# Patient Record
Sex: Female | Born: 1960 | Race: White | Hispanic: No | Smoking: Current every day smoker
Health system: Southern US, Community
[De-identification: ages and names within clinical notes are randomized; demographics above are authoritative.]

## PROBLEM LIST (undated history)

## (undated) DIAGNOSIS — N63 Unspecified lump in unspecified breast: Secondary | ICD-10-CM

## (undated) DIAGNOSIS — F329 Major depressive disorder, single episode, unspecified: Secondary | ICD-10-CM

## (undated) DIAGNOSIS — G629 Polyneuropathy, unspecified: Secondary | ICD-10-CM

## (undated) DIAGNOSIS — E119 Type 2 diabetes mellitus without complications: Secondary | ICD-10-CM

## (undated) DIAGNOSIS — F32A Depression, unspecified: Secondary | ICD-10-CM

## (undated) DIAGNOSIS — I10 Essential (primary) hypertension: Secondary | ICD-10-CM

## (undated) DIAGNOSIS — F331 Major depressive disorder, recurrent, moderate: Secondary | ICD-10-CM

## (undated) DIAGNOSIS — I999 Unspecified disorder of circulatory system: Secondary | ICD-10-CM

## (undated) HISTORY — DX: Major depressive disorder, recurrent, moderate: F33.1

## (undated) HISTORY — PX: TUBAL LIGATION: SHX77

---

## 2010-02-01 HISTORY — PX: OTHER SURGICAL HISTORY: SHX169

## 2012-07-26 ENCOUNTER — Ambulatory Visit: Payer: Self-pay | Admitting: Family Medicine

## 2014-03-27 ENCOUNTER — Ambulatory Visit: Payer: Self-pay | Admitting: Internal Medicine

## 2014-06-08 ENCOUNTER — Encounter: Payer: Self-pay | Admitting: *Deleted

## 2014-06-08 ENCOUNTER — Emergency Department
Admission: EM | Admit: 2014-06-08 | Discharge: 2014-06-08 | Disposition: A | Discharge status: Home / Self Care | Payer: Medicaid Other | Attending: Emergency Medicine | Admitting: Emergency Medicine

## 2014-06-08 DIAGNOSIS — Z880 Allergy status to penicillin: Secondary | ICD-10-CM | POA: Insufficient documentation

## 2014-06-08 DIAGNOSIS — I10 Essential (primary) hypertension: Secondary | ICD-10-CM | POA: Diagnosis not present

## 2014-06-08 DIAGNOSIS — E119 Type 2 diabetes mellitus without complications: Secondary | ICD-10-CM | POA: Insufficient documentation

## 2014-06-08 DIAGNOSIS — Z720 Tobacco use: Secondary | ICD-10-CM | POA: Insufficient documentation

## 2014-06-08 DIAGNOSIS — Z794 Long term (current) use of insulin: Secondary | ICD-10-CM | POA: Insufficient documentation

## 2014-06-08 DIAGNOSIS — R45851 Suicidal ideations: Secondary | ICD-10-CM | POA: Diagnosis present

## 2014-06-08 DIAGNOSIS — F331 Major depressive disorder, recurrent, moderate: Secondary | ICD-10-CM | POA: Diagnosis not present

## 2014-06-08 DIAGNOSIS — F411 Generalized anxiety disorder: Secondary | ICD-10-CM

## 2014-06-08 DIAGNOSIS — Z79899 Other long term (current) drug therapy: Secondary | ICD-10-CM | POA: Diagnosis not present

## 2014-06-08 HISTORY — DX: Essential (primary) hypertension: I10

## 2014-06-08 HISTORY — DX: Major depressive disorder, single episode, unspecified: F32.9

## 2014-06-08 HISTORY — DX: Type 2 diabetes mellitus without complications: E11.9

## 2014-06-08 HISTORY — DX: Depression, unspecified: F32.A

## 2014-06-08 LAB — URINE DRUG SCREEN, QUALITATIVE (ARMC ONLY)
AMPHETAMINES, UR SCREEN: NOT DETECTED
Barbiturates, Ur Screen: NOT DETECTED
Benzodiazepine, Ur Scrn: NOT DETECTED
COCAINE METABOLITE, UR ~~LOC~~: NOT DETECTED
Cannabinoid 50 Ng, Ur ~~LOC~~: POSITIVE — AB
MDMA (ECSTASY) UR SCREEN: NOT DETECTED
Methadone Scn, Ur: NOT DETECTED
Opiate, Ur Screen: NOT DETECTED
PHENCYCLIDINE (PCP) UR S: NOT DETECTED
Tricyclic, Ur Screen: NOT DETECTED

## 2014-06-08 LAB — COMPREHENSIVE METABOLIC PANEL
ALBUMIN: 4.3 g/dL (ref 3.5–5.0)
ALK PHOS: 95 U/L (ref 38–126)
ALT: 14 U/L (ref 14–54)
ANION GAP: 9 (ref 5–15)
AST: 21 U/L (ref 15–41)
BUN: 11 mg/dL (ref 6–20)
CHLORIDE: 103 mmol/L (ref 101–111)
CO2: 26 mmol/L (ref 22–32)
Calcium: 9 mg/dL (ref 8.9–10.3)
Creatinine, Ser: 0.69 mg/dL (ref 0.44–1.00)
GFR calc Af Amer: >60 mL/min (ref >60)
GFR calc non Af Amer: >60 mL/min (ref >60)
GLUCOSE: 83 mg/dL (ref 65–99)
POTASSIUM: 3.8 mmol/L (ref 3.5–5.1)
Sodium: 138 mmol/L (ref 135–145)
Total Bilirubin: 0.3 mg/dL (ref 0.3–1.2)
Total Protein: 7.5 g/dL (ref 6.5–8.1)

## 2014-06-08 LAB — GLUCOSE, CAPILLARY
Glucose-Capillary: 78 mg/dL (ref 70–99)
Glucose-Capillary: 442 mg/dL — ABNORMAL HIGH (ref 70–99)

## 2014-06-08 LAB — CBC
HEMATOCRIT: 44.7 % (ref 35.0–47.0)
Hemoglobin: 15.1 g/dL (ref 12.0–16.0)
MCH: 30.1 pg (ref 26.0–34.0)
MCHC: 33.8 g/dL (ref 32.0–36.0)
MCV: 89.1 fL (ref 80.0–100.0)
Platelets: 364 10*3/uL (ref 150–440)
RBC: 5.02 MIL/uL (ref 3.80–5.20)
RDW: 14.1 % (ref 11.5–14.5)
WBC: 10.9 10*3/uL (ref 3.6–11.0)

## 2014-06-08 LAB — SALICYLATE LEVEL: Salicylate Lvl: <4 mg/dL (ref 2.8–30.0)

## 2014-06-08 LAB — CBG MONITORING, ED: GLUCOSE, FINGERSTICK: 78

## 2014-06-08 LAB — ACETAMINOPHEN LEVEL: Acetaminophen (Tylenol), Serum: <10 ug/mL — ABNORMAL LOW (ref 10–30)

## 2014-06-08 LAB — ETHANOL

## 2014-06-08 MED ORDER — NICOTINE 10 MG IN INHA
1.0000 | RESPIRATORY_TRACT | Status: DC | PRN
  Administered 2014-06-08: 1 via RESPIRATORY_TRACT

## 2014-06-08 MED ORDER — INSULIN ASPART 100 UNIT/ML ~~LOC~~ SOLN: 0.0000 [IU] | Freq: Three times a day (TID) | SUBCUTANEOUS | Status: DC

## 2014-06-08 MED ORDER — SERTRALINE HCL 50 MG PO TABS
ORAL_TABLET | ORAL | Status: AC
  Administered 2014-06-08: 50 mg via ORAL
  Filled 2014-06-08: qty 1

## 2014-06-08 MED ORDER — INSULIN ASPART 100 UNIT/ML ~~LOC~~ SOLN: 0.0000 [IU] | Freq: Once | SUBCUTANEOUS | Status: DC

## 2014-06-08 MED ORDER — INSULIN ASPART 100 UNIT/ML ~~LOC~~ SOLN
SUBCUTANEOUS | Status: AC
  Filled 2014-06-08: qty 10

## 2014-06-08 MED ORDER — GABAPENTIN 300 MG PO CAPS: 300.0000 mg | ORAL_CAPSULE | Freq: Three times a day (TID) | ORAL | Status: DC | PRN

## 2014-06-08 MED ORDER — HYDROXYZINE HCL 25 MG PO TABS: 50.0000 mg | ORAL_TABLET | Freq: Once | ORAL | Status: DC

## 2014-06-08 MED ORDER — INSULIN DETEMIR 100 UNIT/ML ~~LOC~~ SOLN
8.0000 [IU] | Freq: Every day | SUBCUTANEOUS | Status: DC
  Filled 2014-06-08: qty 0.08

## 2014-06-08 MED ORDER — HYDROXYZINE HCL 10 MG PO TABS: 10.0000 mg | ORAL_TABLET | Freq: Three times a day (TID) | ORAL | Status: DC | PRN

## 2014-06-08 MED ORDER — INSULIN ASPART 100 UNIT/ML ~~LOC~~ SOLN
0.0000 [IU] | Freq: Three times a day (TID) | SUBCUTANEOUS | Status: DC
  Administered 2014-06-08: 4 [IU] via SUBCUTANEOUS

## 2014-06-08 MED ORDER — SERTRALINE HCL 50 MG PO TABS
50.0000 mg | ORAL_TABLET | Freq: Every day | ORAL | Status: DC
  Administered 2014-06-08: 50 mg via ORAL

## 2014-06-08 MED ORDER — NICOTINE 10 MG IN INHA
RESPIRATORY_TRACT | Status: AC
  Administered 2014-06-08: 1 via RESPIRATORY_TRACT
  Filled 2014-06-08: qty 36

## 2014-06-08 MED ORDER — LISINOPRIL 20 MG PO TABS
10.0000 mg | ORAL_TABLET | Freq: Every day | ORAL | Status: DC
  Administered 2014-06-08: 10 mg via ORAL

## 2014-06-08 MED ORDER — PANTOPRAZOLE SODIUM 40 MG PO TBEC: 40.0000 mg | DELAYED_RELEASE_TABLET | Freq: Every day | ORAL | Status: DC | PRN

## 2014-06-08 MED ORDER — INSULIN DETEMIR 100 UNIT/ML ~~LOC~~ SOLN
12.0000 [IU] | Freq: Every day | SUBCUTANEOUS | Status: DC
  Administered 2014-06-08: 12 [IU] via SUBCUTANEOUS
  Filled 2014-06-08 (×2): qty 0.12

## 2014-06-08 MED ORDER — LISINOPRIL 20 MG PO TABS
ORAL_TABLET | ORAL | Status: AC
  Administered 2014-06-08: 10 mg via ORAL
  Filled 2014-06-08: qty 1

## 2014-06-08 NOTE — ED Notes (Signed)
Blood glucose 442. SSI ordered being changed per request of Malinda, MD to patients usual dosage of SSI.

## 2014-06-08 NOTE — ED Notes (Signed)
Psych, MD Mcqueen at bedside at this time.

## 2014-06-08 NOTE — BH Assessment (Signed)
Assessment Note  Sabrina Wells is an 54 y.o. female Who presents to ER via law enforcement due to voicing SI to her boyfriend. Pt. states she told him that because he had her car for three days and she was unable to get intouch with him.  She believes he was in [place] for work. However, she wasn't for sure. After the pt. told the boyfriend she was going to "take an extra dose of insulin and end it all," Law Enforcement came to the house, to bring her to the ER. Pt. states, she still hasn't seen the boyfriend and doesn't know where he is at.  She further explains, she has no history of SI/HI and AV/H. She also denies any involvement with the legal system.  She initially lived in [place] and moved to [place] with her husband. After staying in [place], the husband left her. She then moved in with her boyfriend, at that time they were friends. She states, it isn't uncommon for the boyfriend to go away for several days with her car.  Writer called number provided for boyfriend Legrand Como [place]-[phone]). The individual who answered the phone stated he wasn't home and was unable to give a time they would be expected him.   Axis I: Anxiety Disorder NOS Axis III:  Past Medical History  Diagnosis Date  . Diabetes mellitus without complication   . Hypertension   . Depressed    Axis IV: economic problems, housing problems, other psychosocial or environmental problems, problems related to social environment, problems with access to health care services and problems with primary support group  Past Medical History:  Past Medical History  Diagnosis Date  . Diabetes mellitus without complication   . Hypertension   . Depressed     No past surgical history on file.  Family History: No family history on file.  Social History:  reports that she has been smoking Cigarettes.  She does not have any smokeless tobacco history on file. She reports that she uses illicit drugs (Marijuana). She reports that she  does not drink alcohol.  Additional Social History:  Alcohol / Drug Use Pain Medications: None reported Prescriptions: None reported Over the Counter: None reported History of alcohol / drug use?: No history of alcohol / drug abuse Longest period of sobriety (when/how long):  (None reported)  CIWA: CIWA-Ar BP: 140/65 mmHg Pulse Rate: 70 COWS:    Allergies:  Allergies  Allergen Reactions  . Penicillins Hives    Home Medications:  (Not in a hospital admission)  OB/GYN Status:  No LMP recorded. Patient is not currently having periods (Reason: Perimenopausal).  General Assessment Data Location of Assessment: [facility] ED TTS Assessment: In system Is this a Tele or Face-to-Face Assessment?: Face-to-Face Is this an Initial Assessment or a Re-assessment for this encounter?: Initial Assessment Marital status: Single Maiden name: None Is patient pregnant?: No Pregnancy Status: No Living Arrangements: Spouse/significant other Can pt return to current living arrangement?: Yes Admission Status: Involuntary Is patient capable of signing voluntary admission?: Yes Referral Source: Other Risk manager) Insurance type: Medicaid  Medical Screening Exam ([place]) Medical Exam completed: Yes  Crisis Care Plan Living Arrangements: Spouse/significant other  Education Status Is patient currently in school?: No Current Grade: None Highest grade of school patient has completed: 12  Risk to self with the past 6 months Suicidal Ideation: No Suicidal Intent: No Has patient had any suicidal intent within the past 6 months prior to admission? : No Is patient at risk  for suicide?: No Suicidal Plan?: No Has patient had any suicidal plan within the past 6 months prior to admission? : No Access to Means: No What has been your use of drugs/alcohol within the last 12 months?: None reported Previous Attempts/Gestures: No How many times?: 0 Other Self Harm Risks: None  reported Triggers for Past Attempts: Other (Comment) (None reported) Intentional Self Injurious Behavior: None Family Suicide History: No Recent stressful life event(s): Other (Comment) (None reported) Persecutory voices/beliefs?: No Depression: No Depression Symptoms: Feeling angry/irritable Substance abuse history and/or treatment for substance abuse?: No Suicide prevention information given to non-admitted patients: Not applicable  Risk to Others within the past 6 months Homicidal Ideation: No Does patient have any lifetime risk of violence toward others beyond the six months prior to admission? : No Thoughts of Harm to Others: No Current Homicidal Intent: No Current Homicidal Plan: No Access to Homicidal Means: No Identified Victim: None reported History of harm to others?: No Assessment of Violence: None Noted Violent Behavior Description: None reported Does patient have access to weapons?: No Criminal Charges Pending?: No Does patient have a court date: No Is patient on probation?: No  Psychosis Hallucinations: None noted Delusions: None noted  Mental Status Report Appearance/Hygiene: Unremarkable, In hospital gown Eye Contact: Fair Motor Activity: Unremarkable Speech: Logical/coherent Level of Consciousness: Alert Mood: Irritable Affect: Appropriate to circumstance Anxiety Level: Minimal Thought Processes: Coherent, Relevant Judgement: Unimpaired Orientation: Person, Place, Time, Situation, Appropriate for developmental age Obsessive Compulsive Thoughts/Behaviors: None  Cognitive Functioning Concentration: Normal Memory: Recent Intact, Remote Intact IQ: Average Insight: Fair Impulse Control: Fair Appetite: Good Weight Loss: 0 Weight Gain: 0 Sleep: No Change Total Hours of Sleep: 8 Vegetative Symptoms: None  ADLScreening [facility] Assessment Services) Patient's cognitive ability adequate to safely complete daily activities?: Yes Patient able to express  need for assistance with ADLs?: Yes Independently performs ADLs?: Yes (appropriate for developmental age)  Prior Inpatient Therapy Prior Inpatient Therapy: No  Prior Outpatient Therapy Prior Outpatient Therapy: No Does patient have an ACCT team?: No Does patient have Intensive In-House Services?  : No Does patient have [facility] services? : No Does patient have P4CC services?: Unknown  ADL Screening (condition at time of admission) Patient's cognitive ability adequate to safely complete daily activities?: Yes Patient able to express need for assistance with ADLs?: Yes Independently performs ADLs?: Yes (appropriate for developmental age)       Abuse/Neglect Assessment (Assessment to be complete while patient is alone) Physical Abuse: Denies Verbal Abuse: Denies Sexual Abuse: Denies Exploitation of patient/patient's resources: Denies Self-Neglect: Denies Possible abuse reported to::  (None reported) Values / Beliefs Cultural Requests During Hospitalization: None Spiritual Requests During Hospitalization: None Consults Spiritual Care Consult Needed: No Social Work Consult Needed: No Regulatory affairs officer (For Healthcare) Does patient have an advance directive?: No    Additional Information 1:1 In Past 12 Months?: No CIRT Risk: No Elopement Risk: No Does patient have medical clearance?: Yes  Child/Adolescent Assessment Running Away Risk: Denies (Pt. is an adult)  Disposition:  Disposition Initial Assessment Completed for this Encounter: Yes Disposition of Patient: Other dispositions (Psych MD see) Other disposition(s): Other (Comment) (Psych MD to see)  On Site Evaluation by:   Reviewed with Physician:    Gunnar Fusi, MS, LCAS, [place], [place], CSSI 06/08/2014 2:47 PM

## 2014-06-08 NOTE — ED Notes (Signed)
Pt refuses 10 units of regular SSI. States that she can not take that much or it will drop her blood glucose to low. Cinda Quest, MD informed

## 2014-06-08 NOTE — ED Provider Notes (Signed)
-----------------------------------------   5:15 PM on 06/08/2014 -----------------------------------------  Psychiatry recommendations reviewed. Patient is medically and psychiatrically stable at this time with moderate depression. Will follow up with establish care at [facility].  Carrie Mew, MD 06/08/14 [id number]

## 2014-06-08 NOTE — ED Notes (Signed)
cbg 147. Breakfast tray ordered from [facility]

## 2014-06-08 NOTE — ED Notes (Signed)
Blood glucose 303 at this time.

## 2014-06-08 NOTE — ED Provider Notes (Signed)
[facility] Emergency Department Provider Note    ____________________________________________  Time seen: 4:45 AM  I have reviewed the triage vital signs and the nursing notes.   HISTORY  Chief Complaint Suicidal       HPI Sabrina Wells is a 54 y.o. female with a history of insulin-dependent diabetes who presents to the after a argument with her boyfriend. She stated that she wanted to kill herself by overdosing on insulin. Patient denies taking any action and states that she just said this to gain attention, however she did report some passive suicidal thoughts as well to triage nurse which prompted an IVC to be placed.  Patient states she is in her normal state of health, has no acute concerns no hallucinations, she denies being suicidal at this time, denies previous history of suicidality, she does report that she ran out of her antidepressant about one week ago due to financial cost and has a long history of depression.  She does smoke, she denies alcohol abuse, she denies illegal drug abuse save an occasional joint.   Past Medical History  Diagnosis Date  . Diabetes mellitus without complication   . Hypertension   . Depressed     There are no active problems to display for this patient.   No past surgical history on file.  Current Outpatient Rx  Name  Route  Sig  Dispense  Refill  . insulin aspart (NOVOLOG) 100 UNIT/ML injection   Subcutaneous   Inject into the skin 3 (three) times daily with meals. SSI         . insulin detemir (LEVEMIR) 100 UNIT/ML injection   Subcutaneous   Inject 12 Units into the skin 2 (two) times daily. 12 units in morning 8 units at night         . lisinopril (PRINIVIL,ZESTRIL) 10 MG tablet   Oral   Take 10 mg by mouth daily.         Marland Kitchen FLUoxetine (PROZAC) 10 MG capsule   Oral   Take 10 mg by mouth daily. Pt is unsure of dosage.           Allergies Penicillins  No family history on  file.  Social History History  Substance Use Topics  . Smoking status: Current Every Day Smoker    Types: Cigarettes  . Smokeless tobacco: Not on file  . Alcohol Use: No    Review of Systems  Constitutional: Negative for fever. Eyes: Negative for visual changes. ENT: Negative for sore throat. Cardiovascular: Negative for chest pain. Respiratory: Negative for shortness of breath. Gastrointestinal: Negative for abdominal pain, vomiting and diarrhea. Genitourinary: Negative for dysuria. Musculoskeletal: Negative for back pain. Skin: Negative for rash. Neurological: Negative for headaches, focal weakness or numbness. Denies active suicidal ideation, states she did have passive thoughts. Denies any overdose or attempted her life. She specifically denies having injected insulin and his suicide attempt. She denies any homicidal thoughts. She occasionally has disputes with her boyfriend who she states is presently running about in [place].  10-point ROS otherwise negative.  ____________________________________________   PHYSICAL EXAM:  VITAL SIGNS: ED Triage Vitals  Enc Vitals Group     BP 06/08/14 0400 156/98 mmHg     Pulse Rate 06/08/14 0400 90     Resp 06/08/14 0400 20     Temp 06/08/14 0400 97.9 F (36.6 C)     Temp Source 06/08/14 0400 Oral     SpO2 06/08/14 0400 98 %  Weight 06/08/14 0400 110 lb (49.896 kg)     Height 06/08/14 0400 '5\' 3"'$  (1.6 m)     Head Cir --      Peak Flow --      Pain Score 06/08/14 0402 0     Pain Loc --      Pain Edu? --      Excl. in [place]? --      Constitutional: Alert and oriented. Well appearing and in no distress. Eyes: Conjunctivae are normal. PERRL. Normal extraocular movements. ENT   Head: Normocephalic and atraumatic.   Nose: No congestion/rhinnorhea.   Mouth/Throat: Mucous membranes are moist.   Neck: No stridor. Hematological/Lymphatic/Immunilogical: No cervical lymphadenopathy. Cardiovascular: Normal rate,  regular rhythm. Normal and symmetric distal pulses are present in all extremities. No murmurs, rubs, or gallops. Respiratory: Normal respiratory effort without tachypnea nor retractions. Breath sounds are clear and equal bilaterally. No wheezes/rales/rhonchi. Gastrointestinal: Soft and nontender. No distention. No abdominal bruits. There is no CVA tenderness. Genitourinary:  Musculoskeletal: Nontender with normal range of motion in all extremities. No joint effusions.  No lower extremity tenderness nor edema. Neurologic:  Normal speech and language. No gross focal neurologic deficits are appreciated. Speech is normal. No gait instability. Skin:  Skin is warm, dry and intact. No rash noted. Psychiatric: Mood and affect are normal. Speech and behavior are normal. Patient exhibits appropriate insight and judgment.  Overall patient has a very normal exam at this time and seems psychiatrically stable, but we will refer her for psychiatric evaluation given her commitment status and previous statements of suicidality.  ____________________________________________    LABS (pertinent positives/negatives)  Labs Reviewed  ACETAMINOPHEN LEVEL - Abnormal; Notable for the following:    Acetaminophen (Tylenol), Serum <10 (*)    All other components within normal limits  URINE DRUG SCREEN, QUALITATIVE ([facility]) - Abnormal; Notable for the following:    Cannabinoid 50 Ng, Ur [place] POSITIVE (*)    All other components within normal limits  CBC  COMPREHENSIVE METABOLIC PANEL  ETHANOL  SALICYLATE LEVEL  CBG MONITORING, ED  CBG MONITORING, ED  CBG MONITORING, ED  CBG MONITORING, ED     ____________________________________________  ____________________________________________   PROCEDURES  Procedure(s) performed: None  Critical Care performed: No  ____________________________________________   INITIAL IMPRESSION / ASSESSMENT AND PLAN / ED COURSE  Pertinent labs & imaging results that were  available during my care of the patient were reviewed by me and considered in my medical decision making (see chart for details).  Passive suicidal ideation. Major depression.  No acute medical complaints.  Consult psychiatry. Plan signed out to Dr. Rip Harbour.  ____________________________________________   FINAL CLINICAL IMPRESSION(S) / ED DIAGNOSES  Final diagnoses:  None   major depression initial acute   Delman Kitten, MD 06/08/14 0800

## 2014-06-08 NOTE — ED Notes (Signed)
BEHAVIORAL HEALTH ROUNDING Patient sleeping: Yes.   Patient alert and oriented: yes Behavior appropriate: Yes.  ; If no, describe:  Nutrition and fluids offered: Yes  Toileting and hygiene offered: Yes  Sitter present: yes Law enforcement present: Yes  

## 2014-06-08 NOTE — ED Notes (Signed)
Pt to be discharged. Pt given to call a safe ride.

## 2014-06-08 NOTE — Discharge Instructions (Signed)
Major Depressive Disorder Major depressive disorder is a mental illness. It also may be called clinical depression or unipolar depression. Major depressive disorder usually causes feelings of sadness, hopelessness, or helplessness. Some people with this disorder do not feel particularly sad but lose interest in doing things they used to enjoy (anhedonia). Major depressive disorder also can cause physical symptoms. It can interfere with work, school, relationships, and other normal everyday activities. The disorder varies in severity but is longer lasting and more serious than the sadness we all feel from time to time in our lives. Major depressive disorder often is triggered by stressful life events or major life changes. Examples of these triggers include divorce, loss of your job or home, a move, and the death of a family member or close friend. Sometimes this disorder occurs for no obvious reason at all. People who have family members with major depressive disorder or bipolar disorder are at higher risk for developing this disorder, with or without life stressors. Major depressive disorder can occur at any age. It may occur just once in your life (single episode major depressive disorder). It may occur multiple times (recurrent major depressive disorder). SYMPTOMS People with major depressive disorder have either anhedonia or depressed mood on nearly a daily basis for at least 2 weeks or longer. Symptoms of depressed mood include:  Feelings of sadness (blue or down in the dumps) or emptiness.  Feelings of hopelessness or helplessness.  Tearfulness or episodes of crying (may be observed by others).  Irritability (children and adolescents). In addition to depressed mood or anhedonia or both, people with this disorder have at least four of the following symptoms:  Difficulty sleeping or sleeping too much.   Significant change (increase or decrease) in appetite or weight.   Lack of energy or  motivation.  Feelings of guilt and worthlessness.   Difficulty concentrating, remembering, or making decisions.  Unusually slow movement (psychomotor retardation) or restlessness (as observed by others).   Recurrent wishes for death, recurrent thoughts of self-harm (suicide), or a suicide attempt. People with major depressive disorder commonly have persistent negative thoughts about themselves, other people, and the world. People with severe major depressive disorder may experiencedistorted beliefs or perceptions about the world (psychotic delusions). They also may see or hear things that are not real (psychotic hallucinations). DIAGNOSIS Major depressive disorder is diagnosed through an assessment by your health care provider. Your health care provider will ask aboutaspects of your daily life, such as mood,sleep, and appetite, to see if you have the diagnostic symptoms of major depressive disorder. Your health care provider may ask about your medical history and use of alcohol or drugs, including prescription medicines. Your health care provider also may do a physical exam and blood work. This is because certain medical conditions and the use of certain substances can cause major depressive disorder-like symptoms (secondary depression). Your health care provider also may refer you to a mental health specialist for further evaluation and treatment. TREATMENT It is important to recognize the symptoms of major depressive disorder and seek treatment. The following treatments can be prescribed for this disorder:   Medicine. Antidepressant medicines usually are prescribed. Antidepressant medicines are thought to correct chemical imbalances in the brain that are commonly associated with major depressive disorder. Other types of medicine may be added if the symptoms do not respond to antidepressant medicines alone or if psychotic delusions or hallucinations occur.  Talk therapy. Talk therapy can be  helpful in treating major depressive disorder by providing  support, education, and guidance. Certain types of talk therapy also can help with negative thinking (cognitive behavioral therapy) and with relationship issues that trigger this disorder (interpersonal therapy). °A mental health specialist can help determine which treatment is best for you. Most people with major depressive disorder do well with a combination of medicine and talk therapy. Treatments involving electrical stimulation of the brain can be used in situations with extremely severe symptoms or when medicine and talk therapy do not work over time. These treatments include electroconvulsive therapy, transcranial magnetic stimulation, and vagal nerve stimulation. °Document Released: 05/15/2012 Document Revised: 06/04/2013 Document Reviewed: 05/15/2012 °ExitCare® Patient Information ©2015 [employer]. This information is not intended to replace advice given to you by your health care provider. Make sure you discuss any questions you have with your health care provider. ° °

## 2014-06-08 NOTE — ED Notes (Signed)
ED BHU PLACEMENT JUSTIFICATION Is the patient under IVC or is there intent for IVC: No. Is the patient medically cleared: No. Is there vacancy in the ED BHU: No. Is the population mix appropriate for patient: Yes.   Is the patient awaiting placement in inpatient or outpatient setting: No. Has the patient had a psychiatric consult: No. Survey of unit performed for contraband, proper placement and condition of furniture, tampering with fixtures in bathroom, shower, and each patient room: Yes.  ; Findings:  APPEARANCE/BEHAVIOR calm and cooperative NEURO ASSESSMENT Orientation: time, place and person Hallucinations: No.None noted (Hallucinations) Speech: Normal Gait: normal RESPIRATORY ASSESSMENT Normal expansion.  Clear to auscultation.  No rales, rhonchi, or wheezing. CARDIOVASCULAR ASSESSMENT regular rate and rhythm, S1, S2 normal, no murmur, click, rub or gallop GASTROINTESTINAL ASSESSMENT soft, nontender, BS WNL, no r/g EXTREMITIES normal strength, tone, and muscle mass, no deformities PLAN OF CARE Provide calm/safe environment. Vital signs assessed twice daily. ED BHU Assessment once each 12-hour shift. Collaborate with intake RN daily or as condition indicates. Assure the ED provider has rounded once each shift. Provide and encourage hygiene. Provide redirection as needed. Assess for escalating behavior; address immediately and inform ED provider.  Assess family dynamic and appropriateness for visitation as needed: Yes.  ; If necessary, describe findings:  Educate the patient/family about BHU procedures/visitation: Yes.  ; If necessary, describe findings:

## 2014-06-08 NOTE — ED Notes (Signed)
CBG- 266 

## 2014-06-08 NOTE — ED Notes (Signed)

## 2014-06-08 NOTE — ED Notes (Signed)
Nurse called pharmacy pt waiting for Levimir to be sent from pharmacy

## 2014-06-08 NOTE — Consult Note (Addendum)
[facility] Consult   Reason for Consult:  Depression Referring Physician:  [facility]  Patient Identification: Sabrina Wells MRN:  [id number]  Principal Diagnosis: MDD, recurrent, moderate  Diagnosis:   Patient Active Problem List   Diagnosis Date Noted  . GAD (generalized anxiety disorder) [F41.1] 06/08/2014  . MDD (major depressive disorder), recurrent episode, moderate [F33.1]     Total Time spent with patient: 30 minutes  Subjective: "I'm feeling lonely."  HPI:   Sabrina Wells is a 54 y.o. female patient admitted with MDD, recurrent moderate in the context of a poor relationship with her fianc.  She shared she gave her fiance her car to drive to his job and did not hear from him yesterday. He was to return from [place], [place] after his job last night and she shared she was not able to get in contact with him until around 2:30am when she stated "I'm just done with life." She shared she continues to feel lonely in her current situation as her family does not like her partner and will not visit her regularly.  Sabrina Wells noted she has been with in her current relationship for about 27 months.  She shared that after she told her fiance that she was done with him, he called 911.  She denies stating she was suicidal.   Triggers to her current depression include her sister dying of cancer on 02-28-2014. She also shared her mother died 32 years ago of cancer.  She discussed her marriage which was not a good relationship, being increasingly tearful and decreased contact with her family (daughter and grandchildren).  Other triggers include financial stressors. Due to this, she is frustrated and irritable frequently. Her current protective factors include her family & religion. Pt stated she does not want to want to kill herself and reiterated the aforementioned protective factors. She also shared she made these comments to get her fianc to return her car home. She identified  several supportive friends who live down the street and visited the pt while hospitalized. She also shared she has not attempted suicide in the past.  She denies she is a cutter and there are no weapons in the home.   She was on Zoloft in the past which was helpful but she has not taken it in 2 weeks. She states she has not been able to afford her medications. Pt also endoreses restlessness, fatigue, muscle tension, variable sleep. She denies having panic attacks.   Per pt's fianc, the pt has been lonely and "misses her life." The pt stated that she wanted to end her life last night and wanted to overdose on insulin. She did not answer the phone thereafter. The [employer] brought the pt into the ED. The pt has been experiencing more depression and loneliness recently. She has experienced financial difficulties and does not talk to her family as often. Her fianc's family is nearby and willing to help the pt. He stated the pt did what she did in an attempt to get attention from him because he had her car last evening.     HPI Elements:   Location:  see HPI. Quality:  worsening. Severity:  moderate to severe. Timing:  worsening for 2 weeks. Duration:  several months. Context:  see HPI.  Past Medical History:  Past Medical History  Diagnosis Date  . Diabetes mellitus without complication   . Hypertension   . Depressed    No past surgical history on file. Family History: No  family history on file. Social History:  History  Alcohol Use No     History  Drug Use  . Yes  . Special: Marijuana    History   Social History  . Marital Status: Legally Separated    Spouse Name: N/A  . Number of Children: N/A  . Years of Education: N/A   Social History Main Topics  . Smoking status: Current Every Day Smoker    Types: Cigarettes  . Smokeless tobacco: Not on file  . Alcohol Use: No  . Drug Use: Yes    Special: Marijuana  . Sexual Activity: No   Other Topics Concern  . Not on  file   Social History Narrative  . No narrative on file   Additional Social History:    Allergies:   Allergies  Allergen Reactions  . Penicillins Hives    Labs:  Results for orders placed or performed during the hospital encounter of 06/08/14 (from the past 48 hour(s))  CBG monitoring, ED     Status: None   Collection Time: 06/08/14  4:26 AM  Result Value Ref Range   Glucose, fingerstick 78   CBC     Status: None   Collection Time: 06/08/14  4:30 AM  Result Value Ref Range   WBC 10.9 3.6 - 11.0 K/uL   RBC 5.02 3.80 - 5.20 MIL/uL   Hemoglobin 15.1 12.0 - 16.0 g/dL   HCT 44.7 35.0 - 47.0 %   MCV 89.1 80.0 - 100.0 fL   MCH 30.1 26.0 - 34.0 pg   MCHC 33.8 32.0 - 36.0 g/dL   RDW 14.1 11.5 - 14.5 %   Platelets 364 150 - 440 K/uL  Comprehensive metabolic panel     Status: None   Collection Time: 06/08/14  4:30 AM  Result Value Ref Range   Sodium 138 135 - 145 mmol/L   Potassium 3.8 3.5 - 5.1 mmol/L   Chloride 103 101 - 111 mmol/L   CO2 26 22 - 32 mmol/L   Glucose, Bld 83 65 - 99 mg/dL   BUN 11 6 - 20 mg/dL   Creatinine, Ser 0.69 0.44 - 1.00 mg/dL   Calcium 9.0 8.9 - 10.3 mg/dL   Total Protein 7.5 6.5 - 8.1 g/dL   Albumin 4.3 3.5 - 5.0 g/dL   AST 21 15 - 41 U/L   ALT 14 14 - 54 U/L   Alkaline Phosphatase 95 38 - 126 U/L   Total Bilirubin 0.3 0.3 - 1.2 mg/dL   GFR calc non Af Amer >60 >60 mL/min   GFR calc Af Amer >60 >60 mL/min    Comment: (NOTE) The eGFR has been calculated using the CKD EPI equation. This calculation has not been validated in all clinical situations. eGFR's persistently <60 mL/min signify possible Chronic Kidney Disease.    Anion gap 9 5 - 15  Ethanol (ETOH)     Status: None   Collection Time: 06/08/14  4:30 AM  Result Value Ref Range   Alcohol, Ethyl (B) <5 <5 mg/dL    Comment:        LOWEST DETECTABLE LIMIT FOR SERUM ALCOHOL IS 11 mg/dL FOR MEDICAL PURPOSES ONLY   Acetaminophen level     Status: Abnormal   Collection Time: 06/08/14   4:30 AM  Result Value Ref Range   Acetaminophen (Tylenol), Serum <10 (L) 10 - 30 ug/mL    Comment:        THERAPEUTIC CONCENTRATIONS VARY SIGNIFICANTLY. A RANGE OF 10-30  ug/mL MAY BE AN EFFECTIVE CONCENTRATION FOR MANY PATIENTS. HOWEVER, SOME ARE BEST TREATED AT CONCENTRATIONS OUTSIDE THIS RANGE. ACETAMINOPHEN CONCENTRATIONS >150 ug/mL AT 4 HOURS AFTER INGESTION AND >50 ug/mL AT 12 HOURS AFTER INGESTION ARE OFTEN ASSOCIATED WITH TOXIC REACTIONS.   Salicylate level     Status: None   Collection Time: 06/08/14  4:30 AM  Result Value Ref Range   Salicylate Lvl <5.8 2.8 - 30.0 mg/dL  Urine Drug Screen, Qualitative [facility])     Status: Abnormal   Collection Time: 06/08/14  5:17 AM  Result Value Ref Range   Tricyclic, Ur Screen NONE DETECTED NONE DETECTED   Amphetamines, Ur Screen NONE DETECTED NONE DETECTED   MDMA (Ecstasy)Ur Screen NONE DETECTED NONE DETECTED   Cocaine Metabolite,Ur [place] NONE DETECTED NONE DETECTED   Opiate, Ur Screen NONE DETECTED NONE DETECTED   Phencyclidine (PCP) Ur S NONE DETECTED NONE DETECTED   Cannabinoid 50 Ng, Ur [place] POSITIVE (A) NONE DETECTED   Barbiturates, Ur Screen NONE DETECTED NONE DETECTED   Benzodiazepine, Ur Scrn NONE DETECTED NONE DETECTED   Methadone Scn, Ur NONE DETECTED NONE DETECTED    Comment: (NOTE) 527  Tricyclics, urine               Cutoff 1000 ng/mL 200  Amphetamines, urine             Cutoff 1000 ng/mL 300  MDMA (Ecstasy), urine           Cutoff 500 ng/mL 400  Cocaine Metabolite, urine       Cutoff 300 ng/mL 500  Opiate, urine                   Cutoff 300 ng/mL 600  Phencyclidine (PCP), urine      Cutoff 25 ng/mL 700  Cannabinoid, urine              Cutoff 50 ng/mL 800  Barbiturates, urine             Cutoff 200 ng/mL 900  Benzodiazepine, urine           Cutoff 200 ng/mL 1000 Methadone, urine                Cutoff 300 ng/mL 1100 1200 The urine drug screen provides only a preliminary, unconfirmed 1300 analytical test result and  should not be used for non-medical 1400 purposes. Clinical consideration and professional judgment should 1500 be applied to any positive drug screen result due to possible 1600 interfering substances. A more specific alternate chemical method 1700 must be used in order to obtain a confirmed analytical result.  1800 Gas chromato graphy / mass spectrometry (GC/MS) is the preferred 1900 confirmatory method.     Vitals: Blood pressure 140/65, pulse 70, temperature 97.8 F (36.6 C), temperature source Oral, resp. rate 18, height $RemoveBe'5\' 3"'eRhvWOakz$  (1.6 m), weight 49.896 kg (110 lb), SpO2 95 %.  Risk to Self: Is patient at risk for suicide?: Yes Risk to Others:   Prior Inpatient Therapy:   Prior Outpatient Therapy:    Current Facility-Administered Medications  Medication Dose Route Frequency Provider Last Rate Last Dose  . gabapentin (NEURONTIN) capsule 300 mg  300 mg Oral TID PRN Nena Polio, MD      . insulin aspart (novoLOG) injection 0-5 Units  0-5 Units Subcutaneous TID WC & HS Nena Polio, MD   4 Units at 06/08/14 1404  . insulin detemir (LEVEMIR) injection 12 Units  12 Units Subcutaneous Daily Regan Rakers  Cinda Quest, MD   12 Units at 06/08/14 1326  . insulin detemir (LEVEMIR) injection 8 Units  8 Units Subcutaneous QHS Nena Polio, MD      . lisinopril (PRINIVIL,ZESTRIL) tablet 10 mg  10 mg Oral Daily Nena Polio, MD   10 mg at 06/08/14 1325  . nicotine (NICOTROL) 10 MG inhaler 1 continuous puffing  1 continuous puffing Inhalation PRN Delman Kitten, MD   1 continuous puffing at 06/08/14 0640  . pantoprazole (PROTONIX) EC tablet 40 mg  40 mg Oral Daily PRN Nena Polio, MD      . sertraline (ZOLOFT) tablet 50 mg  50 mg Oral Daily Nena Polio, MD   50 mg at 06/08/14 1327   Current Outpatient Prescriptions  Medication Sig Dispense Refill  . gabapentin (NEURONTIN) 300 MG capsule Take 300 mg by mouth 3 (three) times daily as needed (for leg pain.).    Marland Kitchen insulin aspart (NOVOLOG) 100 UNIT/ML  injection Inject into the skin 3 (three) times daily with meals. SSI    . insulin detemir (LEVEMIR) 100 UNIT/ML injection Inject 12 Units into the skin 2 (two) times daily. 12 units in morning 8 units at night    . lisinopril (PRINIVIL,ZESTRIL) 10 MG tablet Take 10 mg by mouth daily.    Marland Kitchen omeprazole (PRILOSEC) 20 MG capsule Take 20 mg by mouth daily as needed (for heartburn or indigestion).    . sertraline (ZOLOFT) 50 MG tablet Take 50 mg by mouth daily.      Musculoskeletal: Strength & Muscle Tone: within normal limits Gait & Station: normal Patient leans: N/A  Psychiatric Specialty Exam: Physical Exam  Review of Systems  Constitutional: Positive for malaise/fatigue.  HENT: Negative.   Eyes: Negative.   Respiratory: Negative.   Cardiovascular: Negative.   Gastrointestinal: Negative.   Genitourinary: Negative.   Musculoskeletal: Negative.   Skin: Negative.   Neurological: Negative.   Endo/Heme/Allergies: Negative.   Psychiatric/Behavioral: Positive for depression. Negative for suicidal ideas, hallucinations, memory loss and substance abuse. The patient is nervous/anxious and has insomnia.     Blood pressure 140/65, pulse 70, temperature 97.8 F (36.6 C), temperature source Oral, resp. rate 18, height $RemoveBe'5\' 3"'EfokhhGxf$  (1.6 m), weight 49.896 kg (110 lb), SpO2 95 %.Body mass index is 19.49 kg/(m^2).  General Appearance: Casual and Fairly Groomed  Engineer, water::  Good  Speech:  Clear and Coherent and Normal Rate  Volume:  Normal  Mood:  Anxious  Affect:  Congruent  Thought Process:  Goal Directed, Linear and Logical  Orientation:  Full (Time, Place, and Person)  Thought Content:  Negative  Suicidal Thoughts:  No  Homicidal Thoughts:  No  Memory:  Immediate;   Good Recent;   Good Remote;   Good  Judgement:  Fair  Insight:  Fair  Psychomotor Activity:  Normal  Concentration:  Good  Recall:  Good  Fund of Knowledge:Good  Language: Negative  Akathisia:  Negative  Handed:  Right   AIMS (if indicated):     Assets:  Communication Skills Desire for Improvement Housing Intimacy Transportation  ADL's:  Intact  Cognition: WNL  Sleep:      Medical Decision Making: Review of Psycho-Social Stressors (1), Review or order clinical lab tests (1), Established Problem, Worsening (2), Review or order medicine tests (1) and Review of New Medication or Change in Dosage (2)  Treatment Plan Summary: Sabrina Wells is a 54 y.o. who presents to the ED for worsening depression. She noted she is currently doing  well at this time and told her fianc that she wanted to overdose on insulin. She stated this was for attention and she will not harm herself. She identified several protective factors as noted in the HPI. Pt also shared she will end the relationship with her fianc soon. Pt denies SI, HI and AVH. She verbally contracts for safety and does not meet criteria for inpatient psychiatric admission at this time.   Plan:  No evidence of imminent risk to self or others at present.   Patient does not meet criteria for psychiatric inpatient admission. Supportive therapy provided about ongoing stressors. Discussed crisis plan, support from social network, calling 911, coming to the Emergency Department, and calling Suicide Hotline.  1. Please provide pt resources for psychiatric and psychotherapeutic resources in the community.  2. Please restart Zoloft 56m po QD.  3. Please proivde pt Vistaril 25-543mpo now for anxiety.   Disposition:  [place]/08/2014 2:05 PM

## 2014-06-08 NOTE — ED Notes (Signed)
Per April RN, CBG 78 in triage.  Pt given meal.

## 2014-06-08 NOTE — ED Notes (Signed)
Patient assigned to appropriate care area. Patient oriented to unit/care area: Informed that, for their safety, care areas are designed for safety and monitored by security cameras at all times; and visiting hours explained to patient. Patient verbalizes understanding, and verbal contract for safety obtained. 

## 2014-06-08 NOTE — ED Notes (Signed)
CBG 274

## 2014-06-08 NOTE — ED Notes (Signed)
BEHAVIORAL HEALTH ROUNDING Patient sleeping: No. Patient alert and oriented: yes Behavior appropriate: Yes.  ; If no, describe:  Nutrition and fluids offered: Yes  Toileting and hygiene offered: Yes  Sitter present: yes Law enforcement present: Yes  

## 2014-06-08 NOTE — ED Notes (Signed)
Pt states she has had thoughts of harming self in past week since running out of prozac. Pt is tearful in triage. Pt has already made several statements saying she isn't staying for long term treatment. Pt is has had recent med change, not taking prozac because she could not afford to get it.

## 2014-06-08 NOTE — ED Provider Notes (Signed)
-----------------------------------------   2:35 PM on 06/08/2014 -----------------------------------------   BP 140/65 mmHg  Pulse 70  Temp(Src) 97.8 F (36.6 C) (Oral)  Resp 18  Ht '5\' 3"'$  (1.6 m)  Wt 110 lb (49.896 kg)  BMI 19.49 kg/m2  SpO2 95%  The patient had no acute events since last update.  Calm and cooperative at this time.  Disposition is pending per Psychiatry/Behavioral Medicine team recommendations.    Nena Polio, MD 06/08/14 1435

## 2014-06-08 NOTE — ED Notes (Signed)
Blood glucose 376

## 2014-06-08 NOTE — ED Notes (Signed)
Breakfast tray given pt eating at this time.

## 2014-06-08 NOTE — ED Notes (Signed)
BEHAVIORAL HEALTH ROUNDING Patient sleeping: No. Patient alert and oriented: yes Behavior appropriate: Yes.  ; If no, describe:  Nutrition and fluids offered: Yes  Toileting and hygiene offered: yes Sitter present: yes Law enforcement present: Yes

## 2014-06-08 NOTE — ED Notes (Signed)
BEHAVIORAL HEALTH ROUNDING Patient sleeping: Yes.   Patient alert and oriented: not applicable Behavior appropriate: Yes.  ; If no, describe:  Nutrition and fluids offered: No Toileting and hygiene offered: No Sitter present: yes Law enforcement present: Yes

## 2014-06-09 LAB — GLUCOSE, CAPILLARY
GLUCOSE-CAPILLARY: 266 mg/dL — AB (ref 70–99)
Glucose-Capillary: 147 mg/dL — ABNORMAL HIGH (ref 70–99)
Glucose-Capillary: 302 mg/dL — ABNORMAL HIGH (ref 70–99)
Glucose-Capillary: 376 mg/dL — ABNORMAL HIGH (ref 70–99)
Glucose-Capillary: 274 mg/dL — ABNORMAL HIGH (ref 70–99)

## 2014-07-17 ENCOUNTER — Encounter: Payer: Self-pay | Admitting: Emergency Medicine

## 2014-07-17 ENCOUNTER — Emergency Department: Payer: Medicaid Other

## 2014-07-17 ENCOUNTER — Inpatient Hospital Stay
Admission: EM | Admit: 2014-07-17 | Discharge: 2014-07-20 | DRG: 816 | Disposition: A | Discharge status: Home / Self Care | Payer: Medicaid Other | Attending: Internal Medicine | Admitting: Internal Medicine

## 2014-07-17 DIAGNOSIS — Z833 Family history of diabetes mellitus: Secondary | ICD-10-CM

## 2014-07-17 DIAGNOSIS — E119 Type 2 diabetes mellitus without complications: Secondary | ICD-10-CM | POA: Diagnosis present

## 2014-07-17 DIAGNOSIS — I739 Peripheral vascular disease, unspecified: Secondary | ICD-10-CM | POA: Diagnosis present

## 2014-07-17 DIAGNOSIS — F329 Major depressive disorder, single episode, unspecified: Secondary | ICD-10-CM | POA: Diagnosis present

## 2014-07-17 DIAGNOSIS — Z716 Tobacco abuse counseling: Secondary | ICD-10-CM | POA: Diagnosis present

## 2014-07-17 DIAGNOSIS — E1169 Type 2 diabetes mellitus with other specified complication: Secondary | ICD-10-CM | POA: Diagnosis present

## 2014-07-17 DIAGNOSIS — K3184 Gastroparesis: Secondary | ICD-10-CM | POA: Diagnosis present

## 2014-07-17 DIAGNOSIS — Z794 Long term (current) use of insulin: Secondary | ICD-10-CM | POA: Diagnosis not present

## 2014-07-17 DIAGNOSIS — D735 Infarction of spleen: Secondary | ICD-10-CM | POA: Diagnosis present

## 2014-07-17 DIAGNOSIS — I10 Essential (primary) hypertension: Secondary | ICD-10-CM | POA: Diagnosis present

## 2014-07-17 DIAGNOSIS — E1165 Type 2 diabetes mellitus with hyperglycemia: Secondary | ICD-10-CM

## 2014-07-17 DIAGNOSIS — Z79899 Other long term (current) drug therapy: Secondary | ICD-10-CM

## 2014-07-17 DIAGNOSIS — E1140 Type 2 diabetes mellitus with diabetic neuropathy, unspecified: Secondary | ICD-10-CM

## 2014-07-17 DIAGNOSIS — F17210 Nicotine dependence, cigarettes, uncomplicated: Secondary | ICD-10-CM | POA: Diagnosis present

## 2014-07-17 DIAGNOSIS — Z880 Allergy status to penicillin: Secondary | ICD-10-CM

## 2014-07-17 DIAGNOSIS — R109 Unspecified abdominal pain: Secondary | ICD-10-CM | POA: Diagnosis present

## 2014-07-17 DIAGNOSIS — C3490 Malignant neoplasm of unspecified part of unspecified bronchus or lung: Secondary | ICD-10-CM

## 2014-07-17 DIAGNOSIS — Z801 Family history of malignant neoplasm of trachea, bronchus and lung: Secondary | ICD-10-CM | POA: Diagnosis not present

## 2014-07-17 DIAGNOSIS — F1290 Cannabis use, unspecified, uncomplicated: Secondary | ICD-10-CM | POA: Diagnosis present

## 2014-07-17 DIAGNOSIS — E785 Hyperlipidemia, unspecified: Secondary | ICD-10-CM | POA: Diagnosis present

## 2014-07-17 DIAGNOSIS — Z136 Encounter for screening for cardiovascular disorders: Secondary | ICD-10-CM | POA: Diagnosis not present

## 2014-07-17 DIAGNOSIS — R1010 Upper abdominal pain, unspecified: Secondary | ICD-10-CM

## 2014-07-17 DIAGNOSIS — K219 Gastro-esophageal reflux disease without esophagitis: Secondary | ICD-10-CM | POA: Diagnosis present

## 2014-07-17 DIAGNOSIS — IMO0002 Reserved for concepts with insufficient information to code with codable children: Secondary | ICD-10-CM

## 2014-07-17 LAB — CBC WITH DIFFERENTIAL/PLATELET
BASOS ABS: 0.1 10*3/uL (ref 0–0.1)
BASOS PCT: 1 %
EOS PCT: 1 %
Eosinophils Absolute: 0.1 10*3/uL (ref 0–0.7)
HEMATOCRIT: 42.7 % (ref 35.0–47.0)
Hemoglobin: 14.1 g/dL (ref 12.0–16.0)
Lymphocytes Relative: 20 %
Lymphs Abs: 2.5 10*3/uL (ref 1.0–3.6)
MCH: 29.3 pg (ref 26.0–34.0)
MCHC: 33 g/dL (ref 32.0–36.0)
MCV: 88.8 fL (ref 80.0–100.0)
MONO ABS: 1.1 10*3/uL — AB (ref 0.2–0.9)
MONOS PCT: 9 %
Neutro Abs: 8.7 10*3/uL — ABNORMAL HIGH (ref 1.4–6.5)
Neutrophils Relative %: 69 %
Platelets: 323 10*3/uL (ref 150–440)
RBC: 4.81 MIL/uL (ref 3.80–5.20)
RDW: 13.7 % (ref 11.5–14.5)
WBC: 12.5 10*3/uL — AB (ref 3.6–11.0)

## 2014-07-17 LAB — URINE DRUG SCREEN, QUALITATIVE (ARMC ONLY)
AMPHETAMINES, UR SCREEN: NOT DETECTED
BENZODIAZEPINE, UR SCRN: NOT DETECTED
Barbiturates, Ur Screen: NOT DETECTED
Cannabinoid 50 Ng, Ur ~~LOC~~: POSITIVE — AB
Cocaine Metabolite,Ur ~~LOC~~: NOT DETECTED
MDMA (ECSTASY) UR SCREEN: NOT DETECTED
Methadone Scn, Ur: NOT DETECTED
Opiate, Ur Screen: POSITIVE — AB
Phencyclidine (PCP) Ur S: NOT DETECTED
Tricyclic, Ur Screen: NOT DETECTED

## 2014-07-17 LAB — COMPREHENSIVE METABOLIC PANEL
ALK PHOS: 88 U/L (ref 38–126)
ALT: 13 U/L — ABNORMAL LOW (ref 14–54)
AST: 22 U/L (ref 15–41)
Albumin: 3.9 g/dL (ref 3.5–5.0)
Anion gap: 8 (ref 5–15)
BILIRUBIN TOTAL: 0.4 mg/dL (ref 0.3–1.2)
BUN: 14 mg/dL (ref 6–20)
CO2: 25 mmol/L (ref 22–32)
Calcium: 8.9 mg/dL (ref 8.9–10.3)
Chloride: 105 mmol/L (ref 101–111)
Creatinine, Ser: 0.7 mg/dL (ref 0.44–1.00)
GFR calc non Af Amer: >60 mL/min (ref >60)
Glucose, Bld: 104 mg/dL — ABNORMAL HIGH (ref 65–99)
POTASSIUM: 3.7 mmol/L (ref 3.5–5.1)
Sodium: 138 mmol/L (ref 135–145)
TOTAL PROTEIN: 6.8 g/dL (ref 6.5–8.1)

## 2014-07-17 LAB — ACETAMINOPHEN LEVEL: Acetaminophen (Tylenol), Serum: <10 ug/mL — ABNORMAL LOW (ref 10–30)

## 2014-07-17 LAB — SALICYLATE LEVEL: Salicylate Lvl: <4 mg/dL (ref 2.8–30.0)

## 2014-07-17 LAB — URINALYSIS COMPLETE WITH MICROSCOPIC (ARMC ONLY)
BILIRUBIN URINE: NEGATIVE
Bacteria, UA: NONE SEEN
GLUCOSE, UA: NEGATIVE mg/dL
Hgb urine dipstick: NEGATIVE
LEUKOCYTES UA: NEGATIVE
NITRITE: NEGATIVE
Protein, ur: 100 mg/dL — AB
Specific Gravity, Urine: 1.011 (ref 1.005–1.030)
pH: 9 — ABNORMAL HIGH (ref 5.0–8.0)

## 2014-07-17 LAB — PROTIME-INR
INR: 0.89
Prothrombin Time: 12.2 seconds (ref 11.4–15.0)

## 2014-07-17 LAB — APTT: aPTT: 31 seconds (ref 24–36)

## 2014-07-17 LAB — GLUCOSE, CAPILLARY: GLUCOSE-CAPILLARY: 85 mg/dL (ref 65–99)

## 2014-07-17 LAB — TROPONIN I: Troponin I: <0.03 ng/mL (ref <0.031)

## 2014-07-17 LAB — ETHANOL: Alcohol, Ethyl (B): <5 mg/dL (ref <5)

## 2014-07-17 LAB — LIPASE, BLOOD: Lipase: 35 U/L (ref 22–51)

## 2014-07-17 MED ORDER — MORPHINE SULFATE 4 MG/ML IJ SOLN
INTRAMUSCULAR | Status: AC
  Filled 2014-07-17: qty 1

## 2014-07-17 MED ORDER — ONDANSETRON HCL 4 MG/2ML IJ SOLN
4.0000 mg | Freq: Four times a day (QID) | INTRAMUSCULAR | Status: DC | PRN
  Administered 2014-07-18 – 2014-07-20 (×7): 4 mg via INTRAVENOUS
  Filled 2014-07-17 (×7): qty 2

## 2014-07-17 MED ORDER — POLYETHYLENE GLYCOL 3350 17 G PO PACK
17.0000 g | PACK | Freq: Every day | ORAL | Status: DC | PRN
  Filled 2014-07-17: qty 1

## 2014-07-17 MED ORDER — SODIUM CHLORIDE 0.9 % IV BOLUS (SEPSIS)
1000.0000 mL | Freq: Once | INTRAVENOUS | Status: AC
  Administered 2014-07-17: 1000 mL via INTRAVENOUS

## 2014-07-17 MED ORDER — ACETAMINOPHEN 325 MG PO TABS: 650.0000 mg | ORAL_TABLET | Freq: Four times a day (QID) | ORAL | Status: DC | PRN

## 2014-07-17 MED ORDER — ONDANSETRON HCL 4 MG/2ML IJ SOLN
INTRAMUSCULAR | Status: AC
  Filled 2014-07-17: qty 2

## 2014-07-17 MED ORDER — ACETAMINOPHEN 650 MG RE SUPP: 650.0000 mg | Freq: Four times a day (QID) | RECTAL | Status: DC | PRN

## 2014-07-17 MED ORDER — MORPHINE SULFATE 2 MG/ML IJ SOLN: 2.0000 mg | INTRAMUSCULAR | Status: DC | PRN

## 2014-07-17 MED ORDER — PROMETHAZINE HCL 25 MG/ML IJ SOLN
INTRAMUSCULAR | Status: AC
  Administered 2014-07-17: 25 mg
  Filled 2014-07-17: qty 1

## 2014-07-17 MED ORDER — SODIUM CHLORIDE 0.9 % IV SOLN
INTRAVENOUS | Status: DC
  Administered 2014-07-18 – 2014-07-20 (×5): via INTRAVENOUS

## 2014-07-17 MED ORDER — SODIUM CHLORIDE 0.9 % IJ SOLN
3.0000 mL | Freq: Two times a day (BID) | INTRAMUSCULAR | Status: DC
  Administered 2014-07-18 – 2014-07-19 (×2): 3 mL via INTRAVENOUS

## 2014-07-17 MED ORDER — IOHEXOL 300 MG/ML  SOLN
100.0000 mL | Freq: Once | INTRAMUSCULAR | Status: AC | PRN
  Administered 2014-07-17: 100 mL via INTRAVENOUS

## 2014-07-17 MED ORDER — ONDANSETRON HCL 4 MG/2ML IJ SOLN
4.0000 mg | Freq: Once | INTRAMUSCULAR | Status: AC
  Administered 2014-07-17: 4 mg via INTRAVENOUS

## 2014-07-17 MED ORDER — IOHEXOL 240 MG/ML SOLN
25.0000 mL | INTRAMUSCULAR | Status: AC
  Administered 2014-07-17: 50 mL via ORAL

## 2014-07-17 MED ORDER — MORPHINE SULFATE 4 MG/ML IJ SOLN
4.0000 mg | Freq: Once | INTRAMUSCULAR | Status: AC
  Administered 2014-07-17: 4 mg via INTRAVENOUS

## 2014-07-17 MED ORDER — ONDANSETRON HCL 4 MG PO TABS: 4.0000 mg | ORAL_TABLET | Freq: Four times a day (QID) | ORAL | Status: DC | PRN

## 2014-07-17 NOTE — ED Notes (Signed)
Pt has completed oral contrast for CT.   CT was notified. Pt reports that she feels much better.   Pain and nausea have improved.

## 2014-07-17 NOTE — ED Notes (Signed)
RN called lab, they stated they could add on PT and INR to existing blood.

## 2014-07-17 NOTE — H&P (Addendum)
[facility] NAME: Sabrina Wells    MR#:  [id number]  DATE OF BIRTH:  [birth year]   DATE OF ADMISSION:  07/17/2014  PRIMARY CARE PHYSICIAN: Halina Maidens, MD   REQUESTING/REFERRING PHYSICIAN: Paduchowski  CHIEF COMPLAINT:   Chief Complaint  Patient presents with  . Abdominal Pain    HISTORY OF PRESENT ILLNESS:  Sabrina Wells  is a 54 y.o. female with a known history of type 2 diabetes insulin requiring, essential hypertension, hyperlipidemia unspecified presenting with abdominal pain. She describes a 1 day duration acute onset abdominal pain epigastric, left upper quadrant in location, sharp in quality, 10/10 intensity, nonradiating, no worsening or relieving factors. She has associated nausea, vomiting (nonbloody/nonbilious). She denies further symptomatology at this time including fevers, chills, preceding illnesses. 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 Case discussed with general surgery (Dr. Pat Patrick) who deemed no surgical intervention required. Patient will require admission for symptom control.  PAST MEDICAL HISTORY:   Past Medical History  Diagnosis Date  . Diabetes mellitus without complication   . Hypertension   . Depressed     PAST SURGICAL HISTORY:   Past Surgical History  Procedure Laterality Date  . Tubal ligation      SOCIAL HISTORY:   History  Substance Use Topics  . Smoking status: Current Every Day Smoker    Types: Cigarettes  . Smokeless tobacco: Not on file  . Alcohol Use: Yes    FAMILY HISTORY:   Family History  Problem Relation Age of Onset  . Diabetes Other     DRUG ALLERGIES:   Allergies  Allergen Reactions  . Penicillins Hives    REVIEW OF SYSTEMS:  REVIEW OF SYSTEMS:  CONSTITUTIONAL: Denies fevers, chills, fatigue, weakness.  EYES: Denies blurred  vision, double vision, or eye pain.  EARS, NOSE, THROAT: Denies tinnitus, ear pain, hearing loss.  RESPIRATORY: denies cough, shortness of breath, wheezing  CARDIOVASCULAR: Denies chest pain, palpitations, edema.  GASTROINTESTINAL: Positive nausea, vomiting, diarrhea, abdominal pain.  GENITOURINARY: Denies dysuria, hematuria.  ENDOCRINE: Denies nocturia or thyroid problems. HEMATOLOGIC AND LYMPHATIC: Denies easy bruising or bleeding.  SKIN: Denies rash or lesions.  MUSCULOSKELETAL: Denies pain in neck, back, shoulder, knees, hips, or further arthritic symptoms.  NEUROLOGIC: Denies paralysis, paresthesias.  PSYCHIATRIC: Denies anxiety or depressive symptoms. Otherwise full review of systems performed by me is negative.   MEDICATIONS AT HOME:   Prior to Admission medications   Medication Sig Start Date End Date Taking? Authorizing Provider  gabapentin (NEURONTIN) 300 MG capsule Take 300 mg by mouth 3 (three) times daily as needed (for leg pain).    Yes Historical Provider, MD  insulin aspart (NOVOLOG) 100 UNIT/ML injection Inject 2-5 Units into the skin 3 (three) times daily with meals as needed for high blood sugar. Pt uses as needed per sliding scale.   Yes Historical Provider, MD  insulin detemir (LEVEMIR) 100 UNIT/ML injection Inject 8-12 Units into the skin 2 (two) times daily. Pt uses 12 units in the morning and 8 units at night.   Yes Historical Provider, MD  lisinopril (PRINIVIL,ZESTRIL) 10 MG tablet Take 10 mg by mouth daily.   Yes Historical Provider, MD  omeprazole (PRILOSEC) 20 MG capsule Take 20 mg by mouth daily as needed (for heartburn).    Yes Historical Provider, MD  sertraline (ZOLOFT) 50 MG tablet Take 50 mg by mouth  daily.   Yes Historical Provider, MD  hydrOXYzine (ATARAX/VISTARIL) 10 MG tablet Take 1 tablet (10 mg total) by mouth 3 (three) times daily as needed. Patient not taking: Reported on 07/17/2014 06/08/14   Carrie Mew, MD      VITAL SIGNS:  Blood pressure  168/88, pulse 68, temperature 97.7 F (36.5 C), temperature source Oral, resp. rate 16, height '5\' 3"'$  (1.6 m), weight 110 lb (49.896 kg), SpO2 99 %.  PHYSICAL EXAMINATION:  VITAL SIGNS: Filed Vitals:   07/17/14 2229  BP: 168/88  Pulse: 68  Temp:   Resp: 2   GENERAL:54 y.o.female currently in mild acute distress given abdominal pain.  HEAD: Normocephalic, atraumatic.  EYES: Pupils equal, round, reactive to light. Extraocular muscles intact. No scleral icterus.  MOUTH: Moist mucosal membrane. Dentition intact. No abscess noted.  EAR, NOSE, THROAT: Clear without exudates. No external lesions.  NECK: Supple. No thyromegaly. No nodules. No JVD.  PULMONARY: Clear to ascultation, without wheeze rails or rhonci. No use of accessory muscles, Good respiratory effort. good air entry bilaterally CHEST: Nontender to palpation.  CARDIOVASCULAR: S1 and S2. Regular rate and rhythm. No murmurs, rubs, or gallops. No edema. Pedal pulses 2+ bilaterally.  GASTROINTESTINAL: Soft, tender to palpation left upper quadrant without rebound, guarding, motion tenderness, nondistended. No masses. Positive bowel sounds. No hepatosplenomegaly.  MUSCULOSKELETAL: No swelling, clubbing, or edema. Range of motion full in all extremities.  NEUROLOGIC: Cranial nerves II through XII are intact. No gross focal neurological deficits. Sensation intact. Reflexes intact.  SKIN: No ulceration, lesions, rashes, or cyanosis. Skin warm and dry. Turgor intact.  PSYCHIATRIC: Mood, affect within normal limits. The patient is awake, alert and oriented x 3. Insight, judgment intact.    LABORATORY PANEL:   CBC  Recent Labs Lab 07/17/14 1804  WBC 12.5*  HGB 14.1  HCT 42.7  PLT 323   ------------------------------------------------------------------------------------------------------------------  Chemistries   Recent Labs Lab 07/17/14 1804  NA 138  K 3.7  CL 105  CO2 25  GLUCOSE 104*  BUN 14  CREATININE 0.70  CALCIUM  8.9  AST 22  ALT 13*  ALKPHOS 88  BILITOT 0.4   ------------------------------------------------------------------------------------------------------------------  Cardiac Enzymes  Recent Labs Lab 07/17/14 1804  TROPONINI <0.03   ------------------------------------------------------------------------------------------------------------------  RADIOLOGY:  Ct Abdomen Pelvis W Contrast  07/17/2014   CLINICAL DATA:  Acute onset of epigastric abdominal pain. Initial encounter.  EXAM: CT ABDOMEN AND PELVIS WITH CONTRAST  TECHNIQUE: Multidetector CT imaging of the abdomen and pelvis was performed using the standard protocol following bolus administration of intravenous contrast.  CONTRAST:  154m OMNIPAQUE IOHEXOL 300 MG/ML  SOLN  COMPARISON:  Abdominal ultrasound performed 03/27/2014  FINDINGS: Mild bibasilar atelectasis is noted.  There appears to be an acute splenic infarct, involving perhaps 20% of the splenic parenchyma. This is of uncertain etiology. The underlying vasculature is grossly unremarkable in appearance.  The gallbladder is unremarkable in appearance. A 1.2 cm hypodensity within the right hepatic lobe is nonspecific but may reflect a small cyst. The pancreas and adrenal glands are within normal limits.  The kidneys are unremarkable in appearance. There is no evidence of hydronephrosis. No renal or ureteral stones are seen. No perinephric stranding is appreciated.  No free fluid is identified. The small bowel is unremarkable in appearance. The stomach is within normal limits. No acute vascular abnormalities are seen. Scattered calcification is noted along the abdominal aorta and its branches.  The appendix is not definitely characterized; there is no evidence of appendicitis. The  colon is unremarkable in appearance.  The bladder is moderately distended and grossly unremarkable. The uterus is within normal limits. The ovaries are relatively symmetric. No suspicious adnexal masses are  seen. No inguinal lymphadenopathy is seen.  No acute osseous abnormalities are identified.  IMPRESSION: 1. Acute splenic infarct noted, involving perhaps 20% of the splenic parenchyma. This is of uncertain etiology. The underlying vasculature is grossly unremarkable. 2. Mild bibasilar atelectasis noted. 3. Nonspecific 1.2 cm hypodensity within the right hepatic lobe may reflect a small cyst. 4. Scattered calcification along the abdominal aorta and its branches.  These results were called by telephone at the time of interpretation on 07/17/2014 at 10:21 pm to Dr. Harvest Dark, who verbally acknowledged these results.   Electronically Signed   By: Garald Balding M.D.   On: 07/17/2014 22:22    EKG:  No orders found for this or any previous visit.  IMPRESSION AND PLAN:   54 year old Caucasian female history of insulin requiring type 2 diabetes essential hypertension is ending with abdominal pain found to have a splenic infarct.  1. Splenic infarct/intractable abdominal pain: Searching for etiologies will place on telemetry looking for A. fib, check an echocardiogram for PFO, check INR/PTT if screening PTT is prolonged we'll do further workup for anti-phospholipid syndrome including anticardiolipin ELISA, anti-beta-2 GP. Treatment is supportive including pain medication, IV fluid hydration, bowel regimen as required, as well as medications for nausea.  2. Type 2 diabetes insulin requiring: Hold oral agents continue basal insulin, at insulin sliding scale every 6 hours Accu-Cheks 3. Essential hypertension: Lisinopril 4. Gastroesophageal reflux disease without esophagitis: PPI therapy 5.Venous thromboembolism prophylactic: SCD     All the records are reviewed and case discussed with ED provider. Management plans discussed with the patient, family and they are in agreement.  CODE STATUS: Full  TOTAL TIME TAKING CARE OF THIS PATIENT: 45 minutes.    Hower,  Karenann Cai.D on 07/17/2014 at 11:08  PM  Between 7am to 6pm - Pager - [phone]  After 6pm: House Pager: - [phone]  [facility]  Office  [phone]  CC: Primary care physician; Halina Maidens, MD

## 2014-07-17 NOTE — ED Provider Notes (Signed)
[facility] Emergency Department Provider Note  Time seen: 6:13 PM  I have reviewed the triage vital signs and the nursing notes.   HISTORY  Chief Complaint Abdominal Pain    HPI Sabrina Wells is a 54 y.o. female with a past medical history of depression depression, hyperlipidemia, hypertension, diabetes who presents the emergency department with abdominal pain, nausea, vomiting, sweatiness. According to the patient since this morning she has had gradually worsening abdominal pain, mostly in the right upper quadrant epigastric area. She is very diaphoretic at times, states nausea and vomiting. Denies diarrhea. Denies dysuria. Describes abdominal pain as severe, epigastric to right upper quadrant area, she is not sure if she has a gallbladder or not.     Past Medical History  Diagnosis Date  . Diabetes mellitus without complication   . Hypertension   . Depressed     Patient Active Problem List   Diagnosis Date Noted  . GAD (generalized anxiety disorder) 06/08/2014  . MDD (major depressive disorder), recurrent episode, moderate     Past Surgical History  Procedure Laterality Date  . Tubal ligation      Current Outpatient Rx  Name  Route  Sig  Dispense  Refill  . gabapentin (NEURONTIN) 300 MG capsule   Oral   Take 300 mg by mouth 3 (three) times daily as needed (for leg pain.).         Marland Kitchen hydrOXYzine (ATARAX/VISTARIL) 10 MG tablet   Oral   Take 1 tablet (10 mg total) by mouth 3 (three) times daily as needed.   30 tablet   0   . insulin aspart (NOVOLOG) 100 UNIT/ML injection   Subcutaneous   Inject into the skin 3 (three) times daily with meals. SSI         . insulin detemir (LEVEMIR) 100 UNIT/ML injection   Subcutaneous   Inject 12 Units into the skin 2 (two) times daily. 12 units in morning 8 units at night         . lisinopril (PRINIVIL,ZESTRIL) 10 MG tablet   Oral   Take 10 mg by mouth daily.         Marland Kitchen omeprazole (PRILOSEC)  20 MG capsule   Oral   Take 20 mg by mouth daily as needed (for heartburn or indigestion).         . sertraline (ZOLOFT) 50 MG tablet   Oral   Take 50 mg by mouth daily.           Allergies Penicillins  No family history on file.  Social History History  Substance Use Topics  . Smoking status: Current Every Day Smoker    Types: Cigarettes  . Smokeless tobacco: Not on file  . Alcohol Use: No    Review of Systems Constitutional: Positive for diaphoresis, did not measure a temperature. Cardiovascular: Negative for chest pain. Respiratory: Negative for shortness of breath. Gastrointestinal: Positive for epigastric and right quadrant abdominal pain. Negative for diarrhea. Positive for nausea and vomiting. Genitourinary: Negative for dysuria. Musculoskeletal: Negative for back pain. Neurological: Negative for headache  10-point ROS otherwise negative.  ____________________________________________   PHYSICAL EXAM:  VITAL SIGNS: ED Triage Vitals  Enc Vitals Group     BP 07/17/14 1758 146/126 mmHg     Pulse Rate 07/17/14 1755 132     Resp 07/17/14 1755 24     Temp 07/17/14 1755 97.5 F (36.4 C)     Temp Source 07/17/14 1755 Oral     SpO2  07/17/14 1755 100 %     Weight 07/17/14 1755 110 lb (49.896 kg)     Height 07/17/14 1755 '5\' 3"'$  (1.6 m)     Head Cir --      Peak Flow --      Pain Score 07/17/14 1756 7     Pain Loc --      Pain Edu? --      Excl. in [place]? --     Constitutional: Alert and oriented. Moderate distress, moderate diaphoresis. Is in pain, states she feels nauseated. Eyes: Normal exam ENT   Head: Normocephalic and atraumatic.   Mouth/Throat: Dry mucous membranes. Cardiovascular: Normal rate, regular rhythm. Respiratory: Normal respiratory effort without tachypnea nor retractions. Breath sounds are clear Gastrointestinal: Moderate right-sided abdominal pain, right upper quadrant, and epigastrium. No rebound or guarding. Otherwise benign  abdominal exam. There is no CVA tenderness. Musculoskeletal: Nontender with normal range of motion in all extremities.  Neurologic:  Normal speech and language. No gross focal neurologic deficits  Skin:  Skin is warm, diaphoretic. Psychiatric: Mood and affect are normal. Speech and behavior are normal.   ____________________________________________   EKG EKG reviewed and interpreted by myself shows normal sinus rhythm at 63 bpm, narrow QRS, normal axis, normal intervals, no concerning ST changes noted.    RADIOLOGY  CT consistent with 20% splenic infarct, unknown origin.  ____________________________________________    INITIAL IMPRESSION / ASSESSMENT AND PLAN / ED COURSE  Pertinent labs & imaging results that were available during my care of the patient were reviewed by me and considered in my medical decision making (see chart for details).  Patient with epigastric and right upper quadrant abdominal pain. Patient is unsure if she has a gallbladder or not. Patient very diaphoretic on exam. No rebound or guarding. We will obtain labs, treat pain and nausea, and closely monitor in the emergency department while awaiting results.  CT consistent with 20% splenic infarct of unknown origin, I discussed with the hospitalist as well as a surgical hospitalist, we'll admit to medicine for pain control.  ____________________________________________   FINAL CLINICAL IMPRESSION(S) / ED DIAGNOSES  Right upper quadrant/epigastric Abdominal pain Splenic infarct   Harvest Dark, MD 07/17/14 2255

## 2014-07-17 NOTE — ED Notes (Signed)
Ems pt , abd pain , n/v/d,  Non complaint with meds,

## 2014-07-17 NOTE — ED Notes (Signed)
Awaiting bed assignment.

## 2014-07-18 ENCOUNTER — Inpatient Hospital Stay (HOSPITAL_COMMUNITY)
Admit: 2014-07-18 | Discharge: 2014-07-18 | Disposition: A | Discharge status: Home / Self Care | Payer: Medicaid Other | Attending: Internal Medicine | Admitting: Internal Medicine

## 2014-07-18 DIAGNOSIS — Z136 Encounter for screening for cardiovascular disorders: Secondary | ICD-10-CM

## 2014-07-18 DIAGNOSIS — D735 Infarction of spleen: Principal | ICD-10-CM

## 2014-07-18 DIAGNOSIS — R109 Unspecified abdominal pain: Secondary | ICD-10-CM

## 2014-07-18 DIAGNOSIS — I10 Essential (primary) hypertension: Secondary | ICD-10-CM

## 2014-07-18 DIAGNOSIS — F17210 Nicotine dependence, cigarettes, uncomplicated: Secondary | ICD-10-CM

## 2014-07-18 DIAGNOSIS — R112 Nausea with vomiting, unspecified: Secondary | ICD-10-CM

## 2014-07-18 DIAGNOSIS — Z809 Family history of malignant neoplasm, unspecified: Secondary | ICD-10-CM

## 2014-07-18 DIAGNOSIS — E119 Type 2 diabetes mellitus without complications: Secondary | ICD-10-CM

## 2014-07-18 DIAGNOSIS — F329 Major depressive disorder, single episode, unspecified: Secondary | ICD-10-CM

## 2014-07-18 DIAGNOSIS — I739 Peripheral vascular disease, unspecified: Secondary | ICD-10-CM

## 2014-07-18 LAB — GLUCOSE, CAPILLARY
GLUCOSE-CAPILLARY: 176 mg/dL — AB (ref 65–99)
GLUCOSE-CAPILLARY: 194 mg/dL — AB (ref 65–99)
GLUCOSE-CAPILLARY: 230 mg/dL — AB (ref 65–99)
Glucose-Capillary: 96 mg/dL (ref 65–99)
Glucose-Capillary: 39 mg/dL — CL (ref 65–99)
Glucose-Capillary: 47 mg/dL — ABNORMAL LOW (ref 65–99)
Glucose-Capillary: 242 mg/dL — ABNORMAL HIGH (ref 65–99)

## 2014-07-18 LAB — CBC
HEMATOCRIT: 40.1 % (ref 35.0–47.0)
HEMOGLOBIN: 13.4 g/dL (ref 12.0–16.0)
MCH: 30 pg (ref 26.0–34.0)
MCHC: 33.3 g/dL (ref 32.0–36.0)
MCV: 89.9 fL (ref 80.0–100.0)
Platelets: 290 10*3/uL (ref 150–440)
RBC: 4.46 MIL/uL (ref 3.80–5.20)
RDW: 13.9 % (ref 11.5–14.5)
WBC: 10.5 10*3/uL (ref 3.6–11.0)

## 2014-07-18 LAB — BASIC METABOLIC PANEL
Anion gap: 10 (ref 5–15)
BUN: 10 mg/dL (ref 6–20)
CALCIUM: 8.4 mg/dL — AB (ref 8.9–10.3)
CO2: 25 mmol/L (ref 22–32)
Chloride: 99 mmol/L — ABNORMAL LOW (ref 101–111)
Creatinine, Ser: 0.68 mg/dL (ref 0.44–1.00)
GFR calc non Af Amer: >60 mL/min (ref >60)
GLUCOSE: 259 mg/dL — AB (ref 65–99)
Potassium: 4 mmol/L (ref 3.5–5.1)
Sodium: 134 mmol/L — ABNORMAL LOW (ref 135–145)

## 2014-07-18 MED ORDER — INSULIN ASPART 100 UNIT/ML ~~LOC~~ SOLN: 0.0000 [IU] | Freq: Every day | SUBCUTANEOUS | Status: DC

## 2014-07-18 MED ORDER — INSULIN DETEMIR 100 UNIT/ML ~~LOC~~ SOLN
12.0000 [IU] | Freq: Every morning | SUBCUTANEOUS | Status: DC
  Administered 2014-07-18 – 2014-07-20 (×3): 12 [IU] via SUBCUTANEOUS
  Filled 2014-07-18 (×5): qty 0.12

## 2014-07-18 MED ORDER — SERTRALINE HCL 50 MG PO TABS
50.0000 mg | ORAL_TABLET | Freq: Every day | ORAL | Status: DC
  Administered 2014-07-18 – 2014-07-20 (×3): 50 mg via ORAL
  Filled 2014-07-18 (×3): qty 1

## 2014-07-18 MED ORDER — LISINOPRIL 10 MG PO TABS
10.0000 mg | ORAL_TABLET | Freq: Every day | ORAL | Status: DC
  Administered 2014-07-18 – 2014-07-20 (×3): 10 mg via ORAL
  Filled 2014-07-18 (×3): qty 1

## 2014-07-18 MED ORDER — PROMETHAZINE HCL 25 MG/ML IJ SOLN
25.0000 mg | Freq: Four times a day (QID) | INTRAMUSCULAR | Status: DC | PRN
  Administered 2014-07-18 – 2014-07-19 (×3): 25 mg via INTRAVENOUS
  Filled 2014-07-18 (×3): qty 1

## 2014-07-18 MED ORDER — DEXTROSE 50 % IV SOLN
INTRAVENOUS | Status: AC
Start: 2014-07-18 — End: 2014-07-19
  Filled 2014-07-18: qty 50

## 2014-07-18 MED ORDER — INSULIN ASPART 100 UNIT/ML ~~LOC~~ SOLN
0.0000 [IU] | Freq: Three times a day (TID) | SUBCUTANEOUS | Status: DC
  Administered 2014-07-18 (×2): 3 [IU] via SUBCUTANEOUS
  Filled 2014-07-18: qty 5
  Filled 2014-07-18: qty 3
  Filled 2014-07-18: qty 2

## 2014-07-18 MED ORDER — DEXTROSE 50 % IV SOLN
1.0000 | Freq: Once | INTRAVENOUS | Status: AC
  Administered 2014-07-18: 50 mL via INTRAVENOUS

## 2014-07-18 MED ORDER — INSULIN DETEMIR 100 UNIT/ML ~~LOC~~ SOLN: 8.0000 [IU] | Freq: Two times a day (BID) | SUBCUTANEOUS | Status: DC

## 2014-07-18 MED ORDER — PANTOPRAZOLE SODIUM 40 MG PO TBEC
40.0000 mg | DELAYED_RELEASE_TABLET | Freq: Every day | ORAL | Status: DC
  Administered 2014-07-18 – 2014-07-20 (×3): 40 mg via ORAL
  Filled 2014-07-18 (×3): qty 1

## 2014-07-18 MED ORDER — GABAPENTIN 300 MG PO CAPS: 300.0000 mg | ORAL_CAPSULE | Freq: Three times a day (TID) | ORAL | Status: DC | PRN

## 2014-07-18 MED ORDER — INSULIN DETEMIR 100 UNIT/ML ~~LOC~~ SOLN
8.0000 [IU] | Freq: Every day | SUBCUTANEOUS | Status: DC
  Administered 2014-07-18 – 2014-07-19 (×2): 8 [IU] via SUBCUTANEOUS
  Filled 2014-07-18 (×4): qty 0.08

## 2014-07-18 NOTE — ED Notes (Signed)
All belongings given to pt upon transport to unit.

## 2014-07-18 NOTE — Consult Note (Signed)
[facility]  Date of admission:  07/18/2014  Inpatient day:  07/18/2014  Consulting physician: Dr. Theodoro Grist   Chief Complaint: Sabrina Wells is an 54 y.o. female admitted through the emergency room with intractable abdominal pain secondary to a splenic infarct.  HPI: The patient notes a history of hypertension, diabetes, and peripheral vascular disease.  She states that she was in her normal state of health until yesterday when she developed "a little pain between my ribs" (points to epigastric region and left upper quadrant).  She then developed nausea and vomiting.  Nothing improved the symptoms.  Because of worsening pain, she presented to the emergency room.  Evaluation included an abdominal and pelvic CT scan.  She was noted to have an acute splenic infarct involving 20% of the spleen.  The underlying vasculature was unremarkable.  There was a 1.2 cm hypodensity within the right hepatic lobe (? small cyst) and scattered calcifications along the abdominal aorta.  Echocardiogram revealed an EF of 60-65% and no evidence of ASD or patent foramen ovale.  There were no valvular abnormalities.  The patient denies any preceding illness, fever, weight loss, abdominal trauma or other systemic symptoms.  She underwent colonoscopy at age 9 or 49 in [place].  She describes having a few polyps.  She has not had a mammogram in 10 years.  She denies any breast masses or concerns.  She has a 40 pack year smoking history.  She denis any respiratory symptoms.  Past Medical History  Diagnosis Date  . Diabetes mellitus without complication   . Hypertension   . Depressed     Past Surgical History  Procedure Laterality Date  . Tubal ligation      Family History  Problem Relation Age of Onset  . Diabetes Other   No family history of thrombosis.  Mother died of throat cancer at age 74 and sister died of metastatic lung cancer at age 41.  Neither her sister or her mother  smoked.  Social History:  reports that she has been smoking Cigarettes.  She does not have any smokeless tobacco history on file. She reports that she drinks alcohol. She reports that she uses illicit drugs (Marijuana).  She began smoking at age 64.  She smokes 1 pack a day.  She denies any IV drug use.  She is disabled secondary to diabetes and vascular disease.  Allergies:  Allergies  Allergen Reactions  . Penicillins Hives    Medications Prior to Admission  Medication Sig Dispense Refill  . gabapentin (NEURONTIN) 300 MG capsule Take 300 mg by mouth 3 (three) times daily as needed (for leg pain).     . insulin aspart (NOVOLOG) 100 UNIT/ML injection Inject 2-5 Units into the skin 3 (three) times daily with meals as needed for high blood sugar. Pt uses as needed per sliding scale.    . insulin detemir (LEVEMIR) 100 UNIT/ML injection Inject 8-12 Units into the skin 2 (two) times daily. Pt uses 12 units in the morning and 8 units at night.    Marland Kitchen lisinopril (PRINIVIL,ZESTRIL) 10 MG tablet Take 10 mg by mouth daily.    Marland Kitchen omeprazole (PRILOSEC) 20 MG capsule Take 20 mg by mouth daily as needed (for heartburn).     . sertraline (ZOLOFT) 50 MG tablet Take 50 mg by mouth daily.    . hydrOXYzine (ATARAX/VISTARIL) 10 MG tablet Take 1 tablet (10 mg total) by mouth 3 (three) times daily as needed. (Patient not taking: Reported  on 07/17/2014) 30 tablet 0    Review of Systems: GENERAL:  Asymptomatic prior to yesterday.  No fevers, sweats or weight loss. PERFORMANCE STATUS (ECOG):  1 HEENT:  No visual changes, runny nose, sore throat, mouth sores or tenderness. Lungs: No shortness of breath or cough.  No hemoptysis. Cardiac:  No chest pain, palpitations, orthopnea, or PND. GI:  Epigastric and left upper quadrant pain.  Nausea,and vomiting.  Mild constipation.  No diarrhea, melena or hematochezia. GU:  No urgency, frequency, dysuria, or hematuria. Musculoskeletal:  No back pain.  No joint pain.  No muscle  tenderness. Extremities:  No pain or swelling. Skin:  No rashes or skin changes. Neuro:  Balance off x 2 years.  No headache, numbness or weakness, or coordination issues. Endocrine:  Diabetes.  No thyroid issues, hot flashes or night sweats. Psych:  No mood changes, depression or anxiety. Pain:  Abdominal pain. Review of systems:  All other systems reviewed and found to be negative.  Physical Exam:  Blood pressure 140/59, pulse 74, temperature 99.7 F (37.6 C), temperature source Oral, resp. rate 20, height _0  (1.6 m), weight 110 lb (49.896 kg), SpO2 100 %.  GENERAL:  Well developed, well nourished woman lying on the medical unit uncomfortable with intermittent abdominal pain and retching.  Appears older than stated age.  Smells of smoke. MENTAL STATUS:  Alert and oriented to person, place and time. HEAD:  Shoulder length red hair.  Normocephalic, atraumatic, face symmetric, no Cushingoid features. EYES:  Blue eyes.  Pupils equal round and reactive to light and accomodation.  No conjunctivitis or scleral icterus. ENT:  Oropharynx clear without lesion.  Tongue normal. Mucous membranes moist.  RESPIRATORY:  Clear to auscultation without rales, wheezes or rhonchi. CARDIOVASCULAR:  Regular rate and rhythm without murmur, rub or gallop. BREAST:  Right breast without masses, skin changes or nipple discharge.  Left breast without masses, skin changes or nipple discharge.  Bilateral fibrocystic changes (left > right). ABDOMEN:  Soft, tender to palpation epigastric region and LUQ; no guarding or rebound tenderness.  Active bowel sounds and no hepatosplenomegaly.  No masses. BACK:  No CVA tenderness.  No tenderness on percussion of the back or rib cage. SKIN:  No rashes, ulcers or lesions. EXTREMITIES: Thin legs.  No edema, no skin discoloration or tenderness.  No palpable cords. LYMPH NODES: No palpable cervical, supraclavicular, axillary or inguinal adenopathy  NEUROLOGICAL:  Unremarkable. PSYCH:  Appropriate.  Results for orders placed or performed during the hospital encounter of 07/17/14 (from the past 48 hour(s))  Comprehensive metabolic panel     Status: Abnormal   Collection Time: 07/17/14  6:04 PM  Result Value Ref Range   Sodium 138 135 - 145 mmol/L   Potassium 3.7 3.5 - 5.1 mmol/L   Chloride 105 101 - 111 mmol/L   CO2 25 22 - 32 mmol/L   Glucose, Bld 104 (H) 65 - 99 mg/dL   BUN 14 6 - 20 mg/dL   Creatinine, Ser 0.70 0.44 - 1.00 mg/dL   Calcium 8.9 8.9 - 10.3 mg/dL   Total Protein 6.8 6.5 - 8.1 g/dL   Albumin 3.9 3.5 - 5.0 g/dL   AST 22 15 - 41 U/L   ALT 13 (L) 14 - 54 U/L   Alkaline Phosphatase 88 38 - 126 U/L   Total Bilirubin 0.4 0.3 - 1.2 mg/dL   GFR calc non Af Amer >60 >60 mL/min   GFR calc Af Amer >60 >60 mL/min  Comment: (NOTE) The eGFR has been calculated using the CKD EPI equation. This calculation has not been validated in all clinical situations. eGFR's persistently <60 mL/min signify possible Chronic Kidney Disease.    Anion gap 8 5 - 15  Lipase, blood     Status: None   Collection Time: 07/17/14  6:04 PM  Result Value Ref Range   Lipase 35 22 - 51 U/L  CBC WITH DIFFERENTIAL     Status: Abnormal   Collection Time: 07/17/14  6:04 PM  Result Value Ref Range   WBC 12.5 (H) 3.6 - 11.0 K/uL   RBC 4.81 3.80 - 5.20 MIL/uL   Hemoglobin 14.1 12.0 - 16.0 g/dL   HCT 42.7 35.0 - 47.0 %   MCV 88.8 80.0 - 100.0 fL   MCH 29.3 26.0 - 34.0 pg   MCHC 33.0 32.0 - 36.0 g/dL   RDW 13.7 11.5 - 14.5 %   Platelets 323 150 - 440 K/uL   Neutrophils Relative % 69 %   Neutro Abs 8.7 (H) 1.4 - 6.5 K/uL   Lymphocytes Relative 20 %   Lymphs Abs 2.5 1.0 - 3.6 K/uL   Monocytes Relative 9 %   Monocytes Absolute 1.1 (H) 0.2 - 0.9 K/uL   Eosinophils Relative 1 %   Eosinophils Absolute 0.1 0 - 0.7 K/uL   Basophils Relative 1 %   Basophils Absolute 0.1 0 - 0.1 K/uL  Troponin I     Status: None   Collection Time: 07/17/14  6:04 PM  Result Value  Ref Range   Troponin I <0.03 <0.031 ng/mL    Comment:        NO INDICATION OF MYOCARDIAL INJURY.   Ethanol     Status: None   Collection Time: 07/17/14  6:04 PM  Result Value Ref Range   Alcohol, Ethyl (B) <5 <5 mg/dL    Comment:        LOWEST DETECTABLE LIMIT FOR SERUM ALCOHOL IS 5 mg/dL FOR MEDICAL PURPOSES ONLY   Acetaminophen level     Status: Abnormal   Collection Time: 07/17/14  6:04 PM  Result Value Ref Range   Acetaminophen (Tylenol), Serum <10 (L) 10 - 30 ug/mL    Comment:        THERAPEUTIC CONCENTRATIONS VARY SIGNIFICANTLY. A RANGE OF 10-30 ug/mL MAY BE AN EFFECTIVE CONCENTRATION FOR MANY PATIENTS. HOWEVER, SOME ARE BEST TREATED AT CONCENTRATIONS OUTSIDE THIS RANGE. ACETAMINOPHEN CONCENTRATIONS >150 ug/mL AT 4 HOURS AFTER INGESTION AND >50 ug/mL AT 12 HOURS AFTER INGESTION ARE OFTEN ASSOCIATED WITH TOXIC REACTIONS.   Salicylate level     Status: None   Collection Time: 07/17/14  6:04 PM  Result Value Ref Range   Salicylate Lvl <0.3 2.8 - 30.0 mg/dL  APTT     Status: None   Collection Time: 07/17/14  6:04 PM  Result Value Ref Range   aPTT 31 24 - 36 seconds  Protime-INR     Status: None   Collection Time: 07/17/14  6:04 PM  Result Value Ref Range   Prothrombin Time 12.2 11.4 - 15.0 seconds   INR 0.89   Glucose, capillary     Status: None   Collection Time: 07/17/14  7:15 PM  Result Value Ref Range   Glucose-Capillary 85 65 - 99 mg/dL  Urinalysis complete, with microscopic ([facility] only)     Status: Abnormal   Collection Time: 07/17/14  7:43 PM  Result Value Ref Range   Color, Urine STRAW (A) YELLOW  APPearance CLEAR (A) CLEAR   Glucose, UA NEGATIVE NEGATIVE mg/dL   Bilirubin Urine NEGATIVE NEGATIVE   Ketones, ur TRACE (A) NEGATIVE mg/dL   Specific Gravity, Urine 1.011 1.005 - 1.030   Hgb urine dipstick NEGATIVE NEGATIVE   pH 9.0 (H) 5.0 - 8.0   Protein, ur 100 (A) NEGATIVE mg/dL   Nitrite NEGATIVE NEGATIVE   Leukocytes, UA NEGATIVE NEGATIVE    RBC / HPF 0-5 0 - 5 RBC/hpf   WBC, UA 0-5 0 - 5 WBC/hpf   Bacteria, UA NONE SEEN NONE SEEN   Squamous Epithelial / LPF 0-5 (A) NONE SEEN  Urine Drug Screen, Qualitative ([facility] only)     Status: Abnormal   Collection Time: 07/17/14  7:43 PM  Result Value Ref Range   Tricyclic, Ur Screen NONE DETECTED NONE DETECTED   Amphetamines, Ur Screen NONE DETECTED NONE DETECTED   MDMA (Ecstasy)Ur Screen NONE DETECTED NONE DETECTED   Cocaine Metabolite,Ur [place] NONE DETECTED NONE DETECTED   Opiate, Ur Screen POSITIVE (A) NONE DETECTED   Phencyclidine (PCP) Ur S NONE DETECTED NONE DETECTED   Cannabinoid 50 Ng, Ur [place] POSITIVE (A) NONE DETECTED   Barbiturates, Ur Screen NONE DETECTED NONE DETECTED   Benzodiazepine, Ur Scrn NONE DETECTED NONE DETECTED   Methadone Scn, Ur NONE DETECTED NONE DETECTED    Comment: (NOTE) 250  Tricyclics, urine               Cutoff 1000 ng/mL 200  Amphetamines, urine             Cutoff 1000 ng/mL 300  MDMA (Ecstasy), urine           Cutoff 500 ng/mL 400  Cocaine Metabolite, urine       Cutoff 300 ng/mL 500  Opiate, urine                   Cutoff 300 ng/mL 600  Phencyclidine (PCP), urine      Cutoff 25 ng/mL 700  Cannabinoid, urine              Cutoff 50 ng/mL 800  Barbiturates, urine             Cutoff 200 ng/mL 900  Benzodiazepine, urine           Cutoff 200 ng/mL 1000 Methadone, urine                Cutoff 300 ng/mL 1100 1200 The urine drug screen provides only a preliminary, unconfirmed 1300 analytical test result and should not be used for non-medical 1400 purposes. Clinical consideration and professional judgment should 1500 be applied to any positive drug screen result due to possible 1600 interfering substances. A more specific alternate chemical method 1700 must be used in order to obtain a confirmed analytical result.  1800 Gas chromato graphy / mass spectrometry (GC/MS) is the preferred 1900 confirmatory method.   Glucose, capillary     Status: Abnormal    Collection Time: 07/18/14 12:50 AM  Result Value Ref Range   Glucose-Capillary 194 (H) 65 - 99 mg/dL  Basic metabolic panel     Status: Abnormal   Collection Time: 07/18/14  5:25 AM  Result Value Ref Range   Sodium 134 (L) 135 - 145 mmol/L   Potassium 4.0 3.5 - 5.1 mmol/L   Chloride 99 (L) 101 - 111 mmol/L   CO2 25 22 - 32 mmol/L   Glucose, Bld 259 (H) 65 - 99 mg/dL   BUN 10 6 -  20 mg/dL   Creatinine, Ser 0.68 0.44 - 1.00 mg/dL   Calcium 8.4 (L) 8.9 - 10.3 mg/dL   GFR calc non Af Amer >60 >60 mL/min   GFR calc Af Amer >60 >60 mL/min    Comment: (NOTE) The eGFR has been calculated using the CKD EPI equation. This calculation has not been validated in all clinical situations. eGFR's persistently <60 mL/min signify possible Chronic Kidney Disease.    Anion gap 10 5 - 15  CBC     Status: None   Collection Time: 07/18/14  5:25 AM  Result Value Ref Range   WBC 10.5 3.6 - 11.0 K/uL   RBC 4.46 3.80 - 5.20 MIL/uL   Hemoglobin 13.4 12.0 - 16.0 g/dL   HCT 40.1 35.0 - 47.0 %   MCV 89.9 80.0 - 100.0 fL   MCH 30.0 26.0 - 34.0 pg   MCHC 33.3 32.0 - 36.0 g/dL   RDW 13.9 11.5 - 14.5 %   Platelets 290 150 - 440 K/uL  Glucose, capillary     Status: Abnormal   Collection Time: 07/18/14  7:36 AM  Result Value Ref Range   Glucose-Capillary 230 (H) 65 - 99 mg/dL   Comment 1 Notify RN   Glucose, capillary     Status: Abnormal   Collection Time: 07/18/14 12:13 PM  Result Value Ref Range   Glucose-Capillary 176 (H) 65 - 99 mg/dL   Comment 1 Notify RN   Glucose, capillary     Status: None   Collection Time: 07/18/14  4:27 PM  Result Value Ref Range   Glucose-Capillary 96 65 - 99 mg/dL   Comment 1 Notify RN   Glucose, capillary     Status: Abnormal   Collection Time: 07/18/14  9:18 PM  Result Value Ref Range   Glucose-Capillary 39 (LL) 65 - 99 mg/dL   Comment 1 Notify RN   Glucose, capillary     Status: Abnormal   Collection Time: 07/18/14  9:24 PM  Result Value Ref Range    Glucose-Capillary 47 (L) 65 - 99 mg/dL   Comment 1 Notify RN    Ct Abdomen Pelvis W Contrast  07/17/2014   CLINICAL DATA:  Acute onset of epigastric abdominal pain. Initial encounter.  EXAM: CT ABDOMEN AND PELVIS WITH CONTRAST  TECHNIQUE: Multidetector CT imaging of the abdomen and pelvis was performed using the standard protocol following bolus administration of intravenous contrast.  CONTRAST:  119m OMNIPAQUE IOHEXOL 300 MG/ML  SOLN  COMPARISON:  Abdominal ultrasound performed 03/27/2014  FINDINGS: Mild bibasilar atelectasis is noted.  There appears to be an acute splenic infarct, involving perhaps 20% of the splenic parenchyma. This is of uncertain etiology. The underlying vasculature is grossly unremarkable in appearance.  The gallbladder is unremarkable in appearance. A 1.2 cm hypodensity within the right hepatic lobe is nonspecific but may reflect a small cyst. The pancreas and adrenal glands are within normal limits.  The kidneys are unremarkable in appearance. There is no evidence of hydronephrosis. No renal or ureteral stones are seen. No perinephric stranding is appreciated.  No free fluid is identified. The small bowel is unremarkable in appearance. The stomach is within normal limits. No acute vascular abnormalities are seen. Scattered calcification is noted along the abdominal aorta and its branches.  The appendix is not definitely characterized; there is no evidence of appendicitis. The colon is unremarkable in appearance.  The bladder is moderately distended and grossly unremarkable. The uterus is within normal limits. The ovaries are  relatively symmetric. No suspicious adnexal masses are seen. No inguinal lymphadenopathy is seen.  No acute osseous abnormalities are identified.  IMPRESSION: 1. Acute splenic infarct noted, involving perhaps 20% of the splenic parenchyma. This is of uncertain etiology. The underlying vasculature is grossly unremarkable. 2. Mild bibasilar atelectasis noted. 3.  Nonspecific 1.2 cm hypodensity within the right hepatic lobe may reflect a small cyst. 4. Scattered calcification along the abdominal aorta and its branches.  These results were called by telephone at the time of interpretation on 07/17/2014 at 10:21 pm to Dr. Harvest Dark, who verbally acknowledged these results.   Electronically Signed   By: Garald Balding M.D.   On: 07/17/2014 22:22    Assessment:  The patient is a 55 y.o. woman with a history of diabetes, hypertension and peripheral vascular disease admitted with acute splenic infarct.  She denies any preceding illness or abdominal trauma.  Echo reveals no PFO or evidence of endocarditis.  CBC reveals no abnormalities suggestive of a myeloproliferative disorder.    Differential diagnosis includes a possible hypercoagulable state or malignancy.  She has no personal or family history of thrombosis.  Last colonoscopy was 4-5 years ago.  She has not had a mammogram in 10 years.  She has a 40 pack year smoking history.  She has no systemic complaints except for acute onset abdominal pain, nausea, and vomiting.  Exam is unremarkable.  Plan:   1)  Discuss differential diagnosis of splenic infarct and planned evaluation. 2)  Labs:  lupus anticoagulant, anticardiolipin antibodies, flow cytometry for PNH, factor V Leiden, and prothrombin gene mutation 3)  Chest CT without contrast- r/o malignancy given smoking history 4)  Anticipate outpatient mammogram.  Thank you for allowing me to participate in [facility] care.  I will follow herclosely with you while hospitalized and after discharge in the outpatient department.  Lequita Asal, MD  07/18/2014, 9:43 PM

## 2014-07-18 NOTE — Progress Notes (Signed)
[facility] [place] NAME: Sabrina Wells    MR#:  [id number]  DATE OF BIRTH:  10/09/1960  SUBJECTIVE:  CHIEF COMPLAINT:   Chief Complaint  Patient presents with  . Abdominal Pain   Presented with abdominal pain, epigastric as well as left upper quadrant sharp with nausea and vomiting,  was diagnosed with splenic infarct and admitted to the hospital for further evaluation and treatment. Hypercoagulable workup was initiated. Patient's case was discussed by emergency room physician with surgery and medical management was suggested. Patient is in significant discomfort, complaining of nausea as well as severe pain in upper abdomen, not able to review systems due to due to acute medical condition Review of Systems  Unable to perform ROS: medical condition    VITAL SIGNS: Blood pressure 141/55, pulse 67, temperature 98.1 F (36.7 C), temperature source Oral, resp. rate 20, height '5\' 3"'$  (1.6 m), weight 49.896 kg (110 lb), SpO2 100 %.  PHYSICAL EXAMINATION:   GENERAL:  54 y.o.-year-old patient lying in the bed in moderate distress clenching her upper abdomen with her arms. Her eyes are closed and suffering expression on her face EYES: Pupils equal, round, reactive to light and accommodation. No scleral icterus. Extraocular muscles intact.  HEENT: Head atraumatic, normocephalic. Oropharynx and nasopharynx clear.  NECK:  Supple, no jugular venous distention. No thyroid enlargement, no tenderness.  LUNGS: Normal breath sounds bilaterally, no wheezing, rales,rhonchi or crepitation. No use of accessory muscles of respiration.  CARDIOVASCULAR: S1, S2 normal. No murmurs, rubs, or gallops.  ABDOMEN: Moderately guarding was noted in the upper abdomen. Moderate tenderness on palpation, nondistended. Bowel sounds present. No organomegaly or mass.  EXTREMITIES: No pedal edema, cyanosis, or clubbing.  NEUROLOGIC: Cranial nerves II through XII are intact.  Muscle strength 5/5 in all extremities. Sensation intact. Gait not checked.  PSYCHIATRIC: The patient is alert and oriented x 3.  SKIN: No obvious rash, lesion, or ulcer.   ORDERS/RESULTS REVIEWED:   CBC  Recent Labs Lab 07/17/14 1804 07/18/14 0525  WBC 12.5* 10.5  HGB 14.1 13.4  HCT 42.7 40.1  PLT 323 290  MCV 88.8 89.9  MCH 29.3 30.0  MCHC 33.0 33.3  RDW 13.7 13.9  LYMPHSABS 2.5  --   MONOABS 1.1*  --   EOSABS 0.1  --   BASOSABS 0.1  --    ------------------------------------------------------------------------------------------------------------------  Chemistries   Recent Labs Lab 07/17/14 1804 07/18/14 0525  NA 138 134*  K 3.7 4.0  CL 105 99*  CO2 25 25  GLUCOSE 104* 259*  BUN 14 10  CREATININE 0.70 0.68  CALCIUM 8.9 8.4*  AST 22  --   ALT 13*  --   ALKPHOS 88  --   BILITOT 0.4  --    ------------------------------------------------------------------------------------------------------------------ estimated creatinine clearance is 61.9 mL/min (by C-G formula based on Cr of 0.68). ------------------------------------------------------------------------------------------------------------------ No results for input(s): TSH, T4TOTAL, T3FREE, THYROIDAB in the last 72 hours.  Invalid input(s): FREET3  Cardiac Enzymes  Recent Labs Lab 07/17/14 1804  TROPONINI <0.03   ------------------------------------------------------------------------------------------------------------------ Invalid input(s): POCBNP ---------------------------------------------------------------------------------------------------------------  RADIOLOGY: Ct Abdomen Pelvis W Contrast  07/17/2014   CLINICAL DATA:  Acute onset of epigastric abdominal pain. Initial encounter.  EXAM: CT ABDOMEN AND PELVIS WITH CONTRAST  TECHNIQUE: Multidetector CT imaging of the abdomen and pelvis was performed using the standard protocol following bolus administration of intravenous contrast.   CONTRAST:  139m OMNIPAQUE IOHEXOL 300 MG/ML  SOLN  COMPARISON:  Abdominal ultrasound  performed 03/27/2014  FINDINGS: Mild bibasilar atelectasis is noted.  There appears to be an acute splenic infarct, involving perhaps 20% of the splenic parenchyma. This is of uncertain etiology. The underlying vasculature is grossly unremarkable in appearance.  The gallbladder is unremarkable in appearance. A 1.2 cm hypodensity within the right hepatic lobe is nonspecific but may reflect a small cyst. The pancreas and adrenal glands are within normal limits.  The kidneys are unremarkable in appearance. There is no evidence of hydronephrosis. No renal or ureteral stones are seen. No perinephric stranding is appreciated.  No free fluid is identified. The small bowel is unremarkable in appearance. The stomach is within normal limits. No acute vascular abnormalities are seen. Scattered calcification is noted along the abdominal aorta and its branches.  The appendix is not definitely characterized; there is no evidence of appendicitis. The colon is unremarkable in appearance.  The bladder is moderately distended and grossly unremarkable. The uterus is within normal limits. The ovaries are relatively symmetric. No suspicious adnexal masses are seen. No inguinal lymphadenopathy is seen.  No acute osseous abnormalities are identified.  IMPRESSION: 1. Acute splenic infarct noted, involving perhaps 20% of the splenic parenchyma. This is of uncertain etiology. The underlying vasculature is grossly unremarkable. 2. Mild bibasilar atelectasis noted. 3. Nonspecific 1.2 cm hypodensity within the right hepatic lobe may reflect a small cyst. 4. Scattered calcification along the abdominal aorta and its branches.  These results were called by telephone at the time of interpretation on 07/17/2014 at 10:21 pm to Dr. Harvest Dark, who verbally acknowledged these results.   Electronically Signed   By: Garald Balding M.D.   On: 07/17/2014 22:22     EKG:  Orders placed or performed during the hospital encounter of 07/17/14  . EKG 12-Lead  . EKG 12-Lead    ASSESSMENT AND PLAN:  Principal Problem:   Splenic infarct Active Problems:   Intractable abdominal pain   Essential hypertension   Hyperlipidemia   Type 2 diabetes mellitus 1. Abdominal pain in epigastric area as well as left upper quadrant, likely due to splenic infarct. Continue pain medications as well as IV fluids and symptomatic therapy for nausea and vomiting 2. Nausea and vomiting. Continue PPI as well as IV fluids, initiate by mouth diet whenever patient is able to do so 3. Diabetes mellitus type II. We'll continue IV fluids as well as sliding scale insulin. The patient is nothing by mouth 4. Essential hypertension. Continue outpatient management. Blood pressure is relatively well controlled, however, elevated due to pain and discomfort   Management plans discussed with the patient, family and they are in agreement.   DRUG ALLERGIES:  Allergies  Allergen Reactions  . Penicillins Hives    CODE STATUS:     Code Status Orders        Start     Ordered   07/17/14 2257  Full code   Continuous     07/17/14 2257      TOTAL TIME TAKING CARE OF THIS PATIENT: 35 minutes.    Theodoro Grist M.D on 07/18/2014 at 12:27 PM  Between 7am to 6pm - Pager - [phone]  After 6pm go to [email or web address] - password [email or web address] [facility]  Office  [phone]  CC: Primary care physician; Halina Maidens, MD

## 2014-07-18 NOTE — Care Management (Signed)
Discussed during progression that at present there are no discharge needs

## 2014-07-18 NOTE — Progress Notes (Signed)
*  PRELIMINARY RESULTS* Echocardiogram 2D Echocardiogram has been performed.  Sabrina Wells 07/18/2014, 9:30 AM

## 2014-07-18 NOTE — Progress Notes (Signed)
Inpatient Diabetes Program Recommendations  AACE/ADA: New Consensus Statement on Inpatient Glycemic Control (2013)  Target Ranges:  Prepandial:   less than 140 mg/dL      Peak postprandial:   less than 180 mg/dL (1-2 hours)      Critically ill patients:  140 - 180 mg/dL    Results for NASHIA, REMUS (MRN [id number]) as of 07/18/2014 13:43  Ref. Range 06/08/2014 17:37 07/17/2014 19:15 07/18/2014 00:50 07/18/2014 07:36 07/18/2014 12:13  Glucose-Capillary Latest Ref Range: 65-99 mg/dL 274 (H) 85 194 (H) 230 (H) 176 (H)    Reason for assessment: elevated CBG  Diabetes history: Type 2 Outpatient Diabetes medications: Novolog 2-5 units based on sliding scale, Levemir 12 units qam, 8units qpm, Metformin '1000mg'$  qam Current orders for Inpatient glycemic control:  Novolog correction 0-9 units tid, 0-5 units qhs, Levemir 12 units qam, 8units qpm, Metformin '1000mg'$  qam  Please consider ordering an A1C to determine overall blood sugar control. Based on today's fasting blood sugar, may need to consider increasing the evening dose of Levemir- reassess tomorrow am. If tomorrow's fasting blood sugars are greater than '140mg'$ /dl, consider increasing from 8units to Levemir 10 units qpm.   Gentry Fitz, RN, BA, MHA, CDE Diabetes Coordinator Inpatient Diabetes Program  [phone] (Team Pager) [phone] ([place]) 07/18/2014 1:49 PM

## 2014-07-19 ENCOUNTER — Inpatient Hospital Stay: Payer: Medicaid Other

## 2014-07-19 LAB — GLUCOSE, CAPILLARY
GLUCOSE-CAPILLARY: 184 mg/dL — AB (ref 65–99)
Glucose-Capillary: 243 mg/dL — ABNORMAL HIGH (ref 65–99)
Glucose-Capillary: 262 mg/dL — ABNORMAL HIGH (ref 65–99)
Glucose-Capillary: 246 mg/dL — ABNORMAL HIGH (ref 65–99)
Glucose-Capillary: 138 mg/dL — ABNORMAL HIGH (ref 65–99)

## 2014-07-19 LAB — CBC
HCT: 37 % (ref 35.0–47.0)
Hemoglobin: 12.3 g/dL (ref 12.0–16.0)
MCH: 29.7 pg (ref 26.0–34.0)
MCHC: 33.2 g/dL (ref 32.0–36.0)
MCV: 89.4 fL (ref 80.0–100.0)
PLATELETS: 265 10*3/uL (ref 150–440)
RBC: 4.14 MIL/uL (ref 3.80–5.20)
RDW: 14.3 % (ref 11.5–14.5)
WBC: 12.2 10*3/uL — ABNORMAL HIGH (ref 3.6–11.0)

## 2014-07-19 MED ORDER — METOCLOPRAMIDE HCL 5 MG/ML IJ SOLN
10.0000 mg | Freq: Four times a day (QID) | INTRAMUSCULAR | Status: AC
  Administered 2014-07-19 – 2014-07-20 (×3): 10 mg via INTRAVENOUS
  Filled 2014-07-19 (×3): qty 2

## 2014-07-19 MED ORDER — INSULIN ASPART 100 UNIT/ML ~~LOC~~ SOLN
1.0000 [IU] | Freq: Three times a day (TID) | SUBCUTANEOUS | Status: DC
  Administered 2014-07-19: 2 [IU] via SUBCUTANEOUS
  Administered 2014-07-19 – 2014-07-20 (×2): 1 [IU] via SUBCUTANEOUS
  Filled 2014-07-19 (×2): qty 1
  Filled 2014-07-19: qty 2

## 2014-07-19 MED ORDER — NICOTINE 10 MG IN INHA
1.0000 | RESPIRATORY_TRACT | Status: DC | PRN
  Administered 2014-07-19: 1 via RESPIRATORY_TRACT
  Filled 2014-07-19: qty 36

## 2014-07-19 MED ORDER — HYDROCODONE-ACETAMINOPHEN 5-325 MG PO TABS: 1.0000 | ORAL_TABLET | ORAL | Status: DC | PRN

## 2014-07-19 MED ORDER — INSULIN ASPART 100 UNIT/ML ~~LOC~~ SOLN
3.0000 [IU] | Freq: Once | SUBCUTANEOUS | Status: AC
  Administered 2014-07-19: 3 [IU] via SUBCUTANEOUS
  Filled 2014-07-19: qty 3

## 2014-07-19 NOTE — Progress Notes (Signed)
Inpatient Diabetes Program Recommendations  AACE/ADA: New Consensus Statement on Inpatient Glycemic Control (2013)  Target Ranges:  Prepandial:   less than 140 mg/dL      Peak postprandial:   less than 180 mg/dL (1-2 hours)      Critically ill patients:  140 - 180 mg/dL   Results for Sabrina Wells, Sabrina Wells (MRN [id number]) as of 07/19/2014 08:44  Ref. Range 07/18/2014 21:18 07/18/2014 21:24 07/18/2014 22:00 07/19/2014 05:10 07/19/2014 08:15  Glucose-Capillary Latest Ref Range: 65-99 mg/dL 39 (LL) 47 (L) 242 (H) 243 (H) 262 (H)   Reason for assessment: elevated CBG  Diabetes history: Type 1- since age 51 Outpatient Diabetes medications: Novolog 2-5 units based on sliding scale, Levemir 12 units q9am, 8units q9pm, Metformin '1000mg'$  qam  Current orders for Inpatient glycemic control: Novolog correction 0-9 units tid, 0-5 units qhs, Levemir 12 units qam, 8units qpm, Metformin '1000mg'$  qam  Please consider ordering an A1C to determine overall blood sugar control.  Please create a custom correction scale for the patient;  CBG 150-200= 1 unit CBG 201-250= 2 units CBG 251-300= 3 units CBG 301-400= 4 units CBG >401 = 5 units  Patient very distraught regarding our sliding scale correction insulin- since she has had diabetes since she was 54 years old, "I know my body", so I created this correction scale in conjunction with the patient.   Gentry Fitz, RN, BA, MHA, CDE Diabetes Coordinator Inpatient Diabetes Program  [phone] (Team Pager) [phone] ([place]) 07/19/2014 9:09 AM

## 2014-07-19 NOTE — Progress Notes (Signed)
[facility] [place] NAME: Sabrina Wells    MR#:  [id number]  DATE OF BIRTH:  1960/04/03  SUBJECTIVE:  CHIEF COMPLAINT:   Chief Complaint  Patient presents with  . Abdominal Pain   Presented with abdominal pain, epigastric as well as left upper quadrant sharp with nausea and vomiting,  was diagnosed with splenic infarct and admitted to the hospital for further evaluation and treatment. Hypercoagulable workup was initiated. Patient's case was discussed by emergency room physician with surgery and medical management was suggested. Patient feels better today, complains of nausea, but no abdominal pain. Complains of burping  Review of Systems  Constitutional: Negative for fever, chills and weight loss.  HENT: Negative for congestion.   Eyes: Negative for blurred vision and double vision.  Respiratory: Negative for cough, sputum production, shortness of breath and wheezing.   Cardiovascular: Negative for chest pain, palpitations, orthopnea, leg swelling and PND.  Gastrointestinal: Positive for nausea and vomiting. Negative for abdominal pain, diarrhea, constipation and blood in stool.  Genitourinary: Negative for dysuria, urgency, frequency and hematuria.  Musculoskeletal: Negative for falls.  Neurological: Negative for dizziness, tremors, focal weakness and headaches.  Endo/Heme/Allergies: Does not bruise/bleed easily.  Psychiatric/Behavioral: Negative for depression. The patient does not have insomnia.     VITAL SIGNS: Blood pressure 165/56, pulse 77, temperature 97.4 F (36.3 C), temperature source Oral, resp. rate 16, height '5\' 3"'$  (1.6 m), weight 49.896 kg (110 lb), SpO2 98 %.  PHYSICAL EXAMINATION:   GENERAL:  54 y.o.-year-old patient lying in the bed still somnolent in am, but not in severe  Distress, moving freely in the bed EYES: Pupils equal, round, reactive to light and accommodation. No scleral icterus. Extraocular muscles  intact.  HEENT: Head atraumatic, normocephalic. Oropharynx and nasopharynx clear.  NECK:  Supple, no jugular venous distention. No thyroid enlargement, no tenderness.  LUNGS: Normal breath sounds bilaterally, no wheezing, rales,rhonchi or crepitation. No use of accessory muscles of respiration.  CARDIOVASCULAR: S1, S2 normal. No murmurs, rubs, or gallops.  ABDOMEN: Mild epigastric guarding was noted .  Bowel sounds present. No organomegaly or mass.  EXTREMITIES: No pedal edema, cyanosis, or clubbing.  NEUROLOGIC: Cranial nerves II through XII are intact. Muscle strength 5/5 in all extremities. Sensation intact. Gait not checked.  PSYCHIATRIC: The patient is alert and oriented x 3.  SKIN: No obvious rash, lesion, or ulcer.   ORDERS/RESULTS REVIEWED:   CBC  Recent Labs Lab 07/17/14 1804 07/18/14 0525 07/19/14 0506  WBC 12.5* 10.5 12.2*  HGB 14.1 13.4 12.3  HCT 42.7 40.1 37.0  PLT 323 290 265  MCV 88.8 89.9 89.4  MCH 29.3 30.0 29.7  MCHC 33.0 33.3 33.2  RDW 13.7 13.9 14.3  LYMPHSABS 2.5  --   --   MONOABS 1.1*  --   --   EOSABS 0.1  --   --   BASOSABS 0.1  --   --    ------------------------------------------------------------------------------------------------------------------  Chemistries   Recent Labs Lab 07/17/14 1804 07/18/14 0525  NA 138 134*  K 3.7 4.0  CL 105 99*  CO2 25 25  GLUCOSE 104* 259*  BUN 14 10  CREATININE 0.70 0.68  CALCIUM 8.9 8.4*  AST 22  --   ALT 13*  --   ALKPHOS 88  --   BILITOT 0.4  --    ------------------------------------------------------------------------------------------------------------------ estimated creatinine clearance is 61.9 mL/min (by C-G formula based on Cr of 0.68). ------------------------------------------------------------------------------------------------------------------ No results for input(s): TSH,  T4TOTAL, T3FREE, THYROIDAB in the last 72 hours.  Invalid input(s): FREET3  Cardiac Enzymes  Recent  Labs Lab 07/17/14 1804  TROPONINI <0.03   ------------------------------------------------------------------------------------------------------------------ Invalid input(s): POCBNP ---------------------------------------------------------------------------------------------------------------  RADIOLOGY: Ct Chest Wo Contrast  07/19/2014   CLINICAL DATA:  Abdominal pain with nausea and vomiting for 2 days. Smoker, evaluate for malignancy.  EXAM: CT CHEST WITHOUT CONTRAST  TECHNIQUE: Multidetector CT imaging of the chest was performed following the standard protocol without IV contrast.  COMPARISON:  CT abdomen pelvis 07/17/2014.  FINDINGS: Mediastinum/Nodes: Mediastinal lymph nodes not enlarged by CT size criteria. Hilar regions are difficult to definitively evaluate without IV contrast. No axillary adenopathy. Three-vessel coronary artery calcification. Heart size normal. No pericardial effusion.  Lungs/Pleura: Moderate centrilobular emphysema. There are a few scattered tiny pulmonary nodules in the upper lobes, measuring up to 2 mm. Lungs are otherwise clear. No pleural fluid. Airway is unremarkable.  Upper abdomen: Visualized portions of the liver, adrenal glands and left kidney are grossly unremarkable. Splenic infarct seen on 07/17/2014 is poorly visualized. Visualized portions of the stomach and bowel are grossly unremarkable. No upper abdominal adenopathy.  Musculoskeletal: No worrisome lytic or sclerotic lesions.  IMPRESSION: 1. A few scattered tiny bilateral pulmonary nodules. Given risk factors for bronchogenic carcinoma, follow-up chest CT at 1 year is recommended. This recommendation follows the consensus statement: Guidelines for Management of Small Pulmonary Nodules Detected on CT Scans: A Statement from the Fleischner Society as published in Radiology 2005; 237:395-400. 2. Three-vessel coronary artery calcification. 3. Splenic infarct seen on 07/17/2014 is poorly visualized.   Electronically  Signed   By: Lorin Picket M.D.   On: 07/19/2014 10:11   Ct Abdomen Pelvis W Contrast  07/17/2014   CLINICAL DATA:  Acute onset of epigastric abdominal pain. Initial encounter.  EXAM: CT ABDOMEN AND PELVIS WITH CONTRAST  TECHNIQUE: Multidetector CT imaging of the abdomen and pelvis was performed using the standard protocol following bolus administration of intravenous contrast.  CONTRAST:  115m OMNIPAQUE IOHEXOL 300 MG/ML  SOLN  COMPARISON:  Abdominal ultrasound performed 03/27/2014  FINDINGS: Mild bibasilar atelectasis is noted.  There appears to be an acute splenic infarct, involving perhaps 20% of the splenic parenchyma. This is of uncertain etiology. The underlying vasculature is grossly unremarkable in appearance.  The gallbladder is unremarkable in appearance. A 1.2 cm hypodensity within the right hepatic lobe is nonspecific but may reflect a small cyst. The pancreas and adrenal glands are within normal limits.  The kidneys are unremarkable in appearance. There is no evidence of hydronephrosis. No renal or ureteral stones are seen. No perinephric stranding is appreciated.  No free fluid is identified. The small bowel is unremarkable in appearance. The stomach is within normal limits. No acute vascular abnormalities are seen. Scattered calcification is noted along the abdominal aorta and its branches.  The appendix is not definitely characterized; there is no evidence of appendicitis. The colon is unremarkable in appearance.  The bladder is moderately distended and grossly unremarkable. The uterus is within normal limits. The ovaries are relatively symmetric. No suspicious adnexal masses are seen. No inguinal lymphadenopathy is seen.  No acute osseous abnormalities are identified.  IMPRESSION: 1. Acute splenic infarct noted, involving perhaps 20% of the splenic parenchyma. This is of uncertain etiology. The underlying vasculature is grossly unremarkable. 2. Mild bibasilar atelectasis noted. 3. Nonspecific  1.2 cm hypodensity within the right hepatic lobe may reflect a small cyst. 4. Scattered calcification along the abdominal aorta and its branches.  These results  were called by telephone at the time of interpretation on 07/17/2014 at 10:21 pm to Dr. Harvest Dark, who verbally acknowledged these results.   Electronically Signed   By: Garald Balding M.D.   On: 07/17/2014 22:22    EKG:  Orders placed or performed during the hospital encounter of 07/17/14  . EKG 12-Lead  . EKG 12-Lead    ASSESSMENT AND PLAN:  Principal Problem:   Splenic infarct Active Problems:   Intractable abdominal pain   Essential hypertension   Hyperlipidemia   Type 2 diabetes mellitus 1. Abdominal pain in epigastric area as well as left upper quadrant, due to splenic infarct. Continue pain medications as well as IV fluids and symptomatic therapy for nausea and vomiting. Add Reglan because I'm concerned about possible diabetic gastroparesis.  2. Nausea and vomiting. Continue PPI as well as IV fluids, advance diet this patient is able to tolerate statin Reglan IV every 6 hours for 24 hours following her condition closely 3. Diabetes mellitus type II. We'll continue IV fluids as well as sliding scale insulin.  Liquid diet to be advanced to regular diet as tolerated 4. Essential hypertension. Continue outpatient management. Blood pressure is elevated due to stress 5 . Tobacco abuse counseling. Discussed this patient for 4 minutes. Nicotine replacement therapy will be initiated as Nicorette inhaler   Management plans discussed with the patient, family and they are in agreement.   DRUG ALLERGIES:  Allergies  Allergen Reactions  . Penicillins Hives    CODE STATUS:     Code Status Orders        Start     Ordered   07/17/14 2257  Full code   Continuous     07/17/14 2257      TOTAL TIME TAKING CARE OF THIS PATIENT: 40 minutes.    Theodoro Grist M.D on 07/19/2014 at 12:31 PM  Between 7am to 6pm - Pager -  [phone]  After 6pm go to [email or web address] - password [email or web address] [facility]  Office  [phone]  CC: Primary care physician; Halina Maidens, MD

## 2014-07-20 ENCOUNTER — Encounter: Payer: Self-pay | Admitting: Internal Medicine

## 2014-07-20 DIAGNOSIS — K3184 Gastroparesis: Secondary | ICD-10-CM

## 2014-07-20 DIAGNOSIS — R918 Other nonspecific abnormal finding of lung field: Secondary | ICD-10-CM | POA: Insufficient documentation

## 2014-07-20 LAB — BASIC METABOLIC PANEL
Anion gap: 6 (ref 5–15)
BUN: 10 mg/dL (ref 6–20)
CALCIUM: 8.2 mg/dL — AB (ref 8.9–10.3)
CO2: 27 mmol/L (ref 22–32)
Chloride: 102 mmol/L (ref 101–111)
Creatinine, Ser: 0.67 mg/dL (ref 0.44–1.00)
GFR calc Af Amer: >60 mL/min (ref >60)
GFR calc non Af Amer: >60 mL/min (ref >60)
GLUCOSE: 140 mg/dL — AB (ref 65–99)
Potassium: 3 mmol/L — ABNORMAL LOW (ref 3.5–5.1)
SODIUM: 135 mmol/L (ref 135–145)

## 2014-07-20 LAB — GLUCOSE, CAPILLARY
GLUCOSE-CAPILLARY: 23 mg/dL — AB (ref 65–99)
GLUCOSE-CAPILLARY: 169 mg/dL — AB (ref 65–99)
Glucose-Capillary: 27 mg/dL — CL (ref 65–99)
Glucose-Capillary: 225 mg/dL — ABNORMAL HIGH (ref 65–99)

## 2014-07-20 LAB — HEMOGLOBIN: HEMOGLOBIN: 12.7 g/dL (ref 12.0–16.0)

## 2014-07-20 MED ORDER — ONDANSETRON HCL 4 MG PO TABS: 4.0000 mg | ORAL_TABLET | Freq: Four times a day (QID) | ORAL | Status: DC | PRN

## 2014-07-20 MED ORDER — DEXTROSE 50 % IV SOLN
1.0000 | Freq: Once | INTRAVENOUS | Status: AC
  Administered 2014-07-20: 50 mL via INTRAVENOUS

## 2014-07-20 MED ORDER — NICOTINE 10 MG IN INHA: 1.0000 | RESPIRATORY_TRACT | Status: DC | PRN

## 2014-07-20 MED ORDER — HYDROCODONE-ACETAMINOPHEN 5-325 MG PO TABS: 1.0000 | ORAL_TABLET | ORAL | Status: DC | PRN

## 2014-07-20 MED ORDER — POLYETHYLENE GLYCOL 3350 17 G PO PACK: 17.0000 g | PACK | Freq: Every day | ORAL | Status: DC | PRN

## 2014-07-20 MED ORDER — METOCLOPRAMIDE HCL 10 MG PO TABS: 10.0000 mg | ORAL_TABLET | Freq: Four times a day (QID) | ORAL | Status: DC

## 2014-07-20 MED ORDER — TRAMADOL HCL 50 MG PO TABS: 50.0000 mg | ORAL_TABLET | Freq: Four times a day (QID) | ORAL | Status: DC | PRN

## 2014-07-20 MED ORDER — TRAMADOL HCL 50 MG PO TABS: 50.0000 mg | ORAL_TABLET | Freq: Four times a day (QID) | ORAL | Status: DC

## 2014-07-20 MED ORDER — DEXTROSE 50 % IV SOLN
INTRAVENOUS | Status: AC
  Administered 2014-07-20: 06:00:00
  Filled 2014-07-20: qty 50

## 2014-07-20 NOTE — Progress Notes (Signed)
Dr Marcille Blanco notified of FS 27,23. Orders given

## 2014-07-20 NOTE — Discharge Instructions (Signed)
Please follow up with her hematologist in 1 week after discharge to learn your lab results Please follow-up with her primary care physician in 2-3 days Notify your doctor if your symptoms return and/or worsen. Take all medications as prescribed.  Thank you

## 2014-07-20 NOTE — Discharge Summary (Signed)
[facility] [place] NAME: Sabrina Wells    MR#:  [id number]  DATE OF BIRTH:  [birth year]  DATE OF ADMISSION:  07/17/2014 ADMITTING PHYSICIAN: Lytle Butte, MD  DATE OF DISCHARGE: No discharge date for patient encounter.  PRIMARY CARE PHYSICIAN: Halina Maidens, MD     ADMISSION DIAGNOSIS:  Splenic infarction [D73.5] Pain of upper abdomen [R10.10]  DISCHARGE DIAGNOSIS:  Principal Problem:   Splenic infarct Active Problems:   Intractable abdominal pain   Gastroparesis   Essential hypertension   Hyperlipidemia   Type 2 diabetes mellitus   SECONDARY DIAGNOSIS:   Past Medical History  Diagnosis Date  . Diabetes mellitus without complication   . Hypertension   . Depressed     .pro HOSPITAL COURSE:   Sabrina Wells is 54 year old female with known history of diabetes mellitus, insulin-requiring, essential hypertension, hyperlipidemia who presented with severe abdominal pain for the past 1 day. It was described as acute onset and left upper quadrant in location, sharp in quality. It was associated with nausea, vomiting. In emergency room, patient had CT of abdomen done which showed acute splenic infarct involving 20% of splenic parenchyma. She was admitted to the hospital for pain control. Since she was having nausea, vomiting, she was also initiated on symptomatic therapy and IV fluids. With this conservative management. Her condition improved significantly and she was ready to be discharged today on 07/20/2014.  Discussion by the problem: 1. Abdominal pain in epigastric area as well as left upper quadrant, due to splenic infarct. Continue pain medications. She is able to eat now, continue Reglan for few more days for known history of gastroparesis. She was seen by oncologist hematologist, Dr. Mike Gip . She recommended lupus anticoagulant,  anticardiolipin antibodies, also flow cytometry done for PNH, factor V Leiden and  prothrombin gene mutation studies,  also recommended to get CT scan of the chest without contrast to rule out malignancy given smoking history. Dr. Mike Gip was concerned about hypercoagulable state versus malignancy. CT scan of the chest revealed diffuse, scattered, tiny bilateral pulmonary nodules and the follow-up CT scan of the chest in one year was recommended. All the other blood studies are still pending and patient is to follow-up with Dr. Mike Gip for further recommendations in approximately one week in [place].  2. Nausea and vomiting. Resolved on PPI as well as IV fluids, now on advanced diet. Discontinue IV fluids, continue Reglan for the next 1 week for known history of gastroparesis, tapering it down  3. Diabetes mellitus type 1. Continue insulin therapy as per patient's home therapy schedule 4. Essential hypertension. Continue outpatient management. Blood pressure is still some elevated due to stress and IV fluids. Discontinue IV fluids 5 . Tobacco abuse counseling. Discussed this patient for 4 minutes yesterday. Nicotine replacement therapy was  initiated , but patient started having nausea and vomiting and I'm concerned that the nicotine patch could be responsible , continue Nicorette inhaler as scheduled DISCHARGE CONDITIONS:   Fair  CONSULTS OBTAINED:  Treatment Team:  Lytle Butte, MD Lequita Asal, MD  DRUG ALLERGIES:   Allergies  Allergen Reactions  . Penicillins Hives    DISCHARGE MEDICATIONS:   Current Discharge Medication List    START taking these medications   Details  HYDROcodone-acetaminophen (NORCO/VICODIN) 5-325 MG per tablet Take 1 tablet by mouth every 4 (four) hours as needed for moderate pain. Qty: 30 tablet, Refills: 0    metoCLOPramide (REGLAN) 10 MG  tablet Take 1 tablet (10 mg total) by mouth 4 (four) times daily. Qty: 40 tablet, Refills: 0    nicotine (NICOTROL) 10 MG inhaler Inhale 1 cartridge (1 continuous puffing total) into the  lungs as needed for smoking cessation. Qty: 42 each, Refills: 0    ondansetron (ZOFRAN) 4 MG tablet Take 1 tablet (4 mg total) by mouth every 6 (six) hours as needed for nausea. Qty: 20 tablet, Refills: 0    polyethylene glycol (MIRALAX / GLYCOLAX) packet Take 17 g by mouth daily as needed for mild constipation. Qty: 14 each, Refills: 0      CONTINUE these medications which have NOT CHANGED   Details  gabapentin (NEURONTIN) 300 MG capsule Take 300 mg by mouth 3 (three) times daily as needed (for leg pain).     insulin aspart (NOVOLOG) 100 UNIT/ML injection Inject 2-5 Units into the skin 3 (three) times daily with meals as needed for high blood sugar. Pt uses as needed per sliding scale.    insulin detemir (LEVEMIR) 100 UNIT/ML injection Inject 8-12 Units into the skin 2 (two) times daily. Pt uses 12 units in the morning and 8 units at night.    lisinopril (PRINIVIL,ZESTRIL) 10 MG tablet Take 10 mg by mouth daily.    omeprazole (PRILOSEC) 20 MG capsule Take 20 mg by mouth daily as needed (for heartburn).     sertraline (ZOLOFT) 50 MG tablet Take 50 mg by mouth daily.    hydrOXYzine (ATARAX/VISTARIL) 10 MG tablet Take 1 tablet (10 mg total) by mouth 3 (three) times daily as needed. Qty: 30 tablet, Refills: 0         DISCHARGE INSTRUCTIONS:    Please continue pain medications as needed. Follow-up with Dr. Mike Gip for lab results.   If you experience worsening of your admission symptoms, develop shortness of breath, life threatening emergency, suicidal or homicidal thoughts you must seek medical attention immediately by calling 911 or calling your MD immediately  if symptoms less severe.  You Must read complete instructions/literature along with all the possible adverse reactions/side effects for all the Medicines you take and that have been prescribed to you. Take any new Medicines after you have completely understood and accept all the possible adverse reactions/side effects.    Please note  You were cared for by a hospitalist during your hospital stay. If you have any questions about your discharge medications or the care you received while you were in the hospital after you are discharged, you can call the unit and asked to speak with the hospitalist on call if the hospitalist that took care of you is not available. Once you are discharged, your primary care physician will handle any further medical issues. Please note that NO REFILLS for any discharge medications will be authorized once you are discharged, as it is imperative that you return to your primary care physician (or establish a relationship with a primary care physician if you do not have one) for your aftercare needs so that they can reassess your need for medications and monitor your lab values.    Today   CHIEF COMPLAINT:   Chief Complaint  Patient presents with  . Abdominal Pain    HISTORY OF PRESENT ILLNESS:  Sabrina Wells  is a 54 y.o. female with a known history of multiple medical problems including diabetes mellitus, hypertension,  gastrointestinal bleed in the past,  also gastroparesis who presents to the hospital with complaints of abdominal pain. It was described as acute onset  and left upper quadrant in location, sharp in quality. It was associated with nausea, vomiting. In emergency room, patient had CT of abdomen done which showed acute splenic infarct involving 20% of splenic parenchyma. She was admitted to the hospital for pain control. Since she was having nausea, vomiting, she was also initiated on symptomatic therapy and IV fluids. With this conservative management. Her condition improved significantly and she was ready to be discharged today on 07/20/2014.  Discussion by the problem: 1. Abdominal pain in epigastric area as well as left upper quadrant, due to splenic infarct. Continue pain medications. She is able to eat now, continue Reglan for few more days for known history of  gastroparesis. She was seen by oncologist hematologist, Dr. Mike Gip . She recommended lupus anticoagulant,  anticardiolipin antibodies, also flow cytometry done for PNH, factor V Leiden and prothrombin gene mutation studies,  also recommended to get CT scan of the chest without contrast to rule out malignancy given smoking history. Dr. Mike Gip was concerned about hypercoagulable state versus malignancy. CT scan of the chest revealed diffuse, scattered, tiny bilateral pulmonary nodules and the follow-up CT scan of the chest in one year was recommended. All the other blood studies are still pending and patient is to follow-up with Dr. Mike Gip for further recommendations in approximately one week in [place].  2. Nausea and vomiting. Resolved on PPI as well as IV fluids, now on advanced diet. Discontinue IV fluids, continue Reglan for the next 1 week for known history of gastroparesis, tapering it down  3. Diabetes mellitus type 1. Continue insulin therapy as per patient's home therapy schedule 4. Essential hypertension. Continue outpatient management. Blood pressure is still some elevated due to stress and IV fluids. Discontinue IV fluids 5 . Tobacco abuse counseling. Discussed this patient for 4 minutes yesterday. Nicotine replacement therapy was  initiated , but patient started having nausea and vomiting and I'm concerned that the nicotine patch could be responsible , continue Nicorette inhaler as scheduled    VITAL SIGNS:  Blood pressure 140/60, pulse 82, temperature 98.2 F (36.8 C), temperature source Oral, resp. rate 19, height '5\' 3"'$  (1.6 m), weight 49.896 kg (110 lb), SpO2 97 %.  I/O:   Intake/Output Summary (Last 24 hours) at 07/20/14 1051 Last data filed at 07/20/14 0802  Gross per 24 hour  Intake 3450.85 ml  Output   2450 ml  Net 1000.85 ml    PHYSICAL EXAMINATION:  GENERAL:  54 y.o.-year-old patient lying in the bed with no acute distress.  EYES: Pupils equal, round,  reactive to light and accommodation. No scleral icterus. Extraocular muscles intact.  HEENT: Head atraumatic, normocephalic. Oropharynx and nasopharynx clear.  NECK:  Supple, no jugular venous distention. No thyroid enlargement, no tenderness.  LUNGS: Normal breath sounds bilaterally, no wheezing, rales,rhonchi or crepitation. No use of accessory muscles of respiration.  CARDIOVASCULAR: S1, S2 normal. No murmurs, rubs, or gallops.  ABDOMEN: Soft, non-tender, non-distended. Bowel sounds present. No organomegaly or mass.  EXTREMITIES: No pedal edema, cyanosis, or clubbing.  NEUROLOGIC: Cranial nerves II through XII are intact. Muscle strength 5/5 in all extremities. Sensation intact. Gait not checked.  PSYCHIATRIC: The patient is alert and oriented x 3.  SKIN: No obvious rash, lesion, or ulcer.   DATA REVIEW:   CBC  Recent Labs Lab 07/19/14 0506 07/20/14 0530  WBC 12.2*  --   HGB 12.3 12.7  HCT 37.0  --   PLT 265  --     Chemistries  Recent Labs Lab 07/17/14 1804  07/20/14 0530  NA 138  < > 135  K 3.7  < > 3.0*  CL 105  < > 102  CO2 25  < > 27  GLUCOSE 104*  < > 140*  BUN 14  < > 10  CREATININE 0.70  < > 0.67  CALCIUM 8.9  < > 8.2*  AST 22  --   --   ALT 13*  --   --   ALKPHOS 88  --   --   BILITOT 0.4  --   --   < > = values in this interval not displayed.  Cardiac Enzymes  Recent Labs Lab 07/17/14 [place] <0.03    Microbiology Results  No results found for this or any previous visit.  RADIOLOGY:  Ct Chest Wo Contrast  07/19/2014   CLINICAL DATA:  Abdominal pain with nausea and vomiting for 2 days. Smoker, evaluate for malignancy.  EXAM: CT CHEST WITHOUT CONTRAST  TECHNIQUE: Multidetector CT imaging of the chest was performed following the standard protocol without IV contrast.  COMPARISON:  CT abdomen pelvis 07/17/2014.  FINDINGS: Mediastinum/Nodes: Mediastinal lymph nodes not enlarged by CT size criteria. Hilar regions are difficult to definitively  evaluate without IV contrast. No axillary adenopathy. Three-vessel coronary artery calcification. Heart size normal. No pericardial effusion.  Lungs/Pleura: Moderate centrilobular emphysema. There are a few scattered tiny pulmonary nodules in the upper lobes, measuring up to 2 mm. Lungs are otherwise clear. No pleural fluid. Airway is unremarkable.  Upper abdomen: Visualized portions of the liver, adrenal glands and left kidney are grossly unremarkable. Splenic infarct seen on 07/17/2014 is poorly visualized. Visualized portions of the stomach and bowel are grossly unremarkable. No upper abdominal adenopathy.  Musculoskeletal: No worrisome lytic or sclerotic lesions.  IMPRESSION: 1. A few scattered tiny bilateral pulmonary nodules. Given risk factors for bronchogenic carcinoma, follow-up chest CT at 1 year is recommended. This recommendation follows the consensus statement: Guidelines for Management of Small Pulmonary Nodules Detected on CT Scans: A Statement from the Fleischner Society as published in Radiology 2005; 237:395-400. 2. Three-vessel coronary artery calcification. 3. Splenic infarct seen on 07/17/2014 is poorly visualized.   Electronically Signed   By: Lorin Picket M.D.   On: 07/19/2014 10:11    EKG:   Orders placed or performed during the hospital encounter of 07/17/14  . EKG 12-Lead  . EKG 12-Lead      Management plans discussed with the patient, family and they are in agreement.  CODE STATUS:     Code Status Orders        Start     Ordered   07/17/14 2257  Full code   Continuous     07/17/14 2257      TOTAL TIME TAKING CARE OF THIS PATIENT: 40 minutes.    Theodoro Grist M.D on 07/20/2014 at 10:51 AM  Between 7am to 6pm - Pager - [phone]  After 6pm go to [email or web address] - password [email or web address] [facility]  Office  [phone]  CC: Primary care physician; Halina Maidens, MD

## 2014-07-24 ENCOUNTER — Other Ambulatory Visit: Payer: Self-pay | Admitting: Internal Medicine

## 2014-07-24 ENCOUNTER — Other Ambulatory Visit: Payer: Self-pay

## 2014-07-24 LAB — CARDIOLIPIN ANTIBODIES, IGG, IGM, IGA
Anticardiolipin IgA: <9 APL U/mL (ref 0–11)
Anticardiolipin IgG: <9 GPL U/mL (ref 0–14)
Anticardiolipin IgM: <9 MPL U/mL (ref 0–12)

## 2014-07-24 LAB — PROTHROMBIN GENE MUTATION

## 2014-07-24 LAB — LUPUS ANTICOAGULANT PANEL
DRVVT: 27.8 s (ref 0.0–55.1)
PTT Lupus Anticoagulant: 37.2 s (ref 0.0–50.0)

## 2014-07-24 LAB — FACTOR 5 LEIDEN

## 2014-07-24 MED ORDER — INSULIN DETEMIR 100 UNIT/ML ~~LOC~~ SOLN: 8.0000 [IU] | Freq: Two times a day (BID) | SUBCUTANEOUS | Status: DC

## 2014-08-30 ENCOUNTER — Other Ambulatory Visit: Payer: Self-pay | Admitting: Internal Medicine

## 2014-08-30 ENCOUNTER — Telehealth: Payer: Self-pay

## 2014-08-30 MED ORDER — INSULIN ASPART 100 UNIT/ML FLEXPEN: 10.0000 [IU] | PEN_INJECTOR | Freq: Three times a day (TID) | SUBCUTANEOUS | Status: DC

## 2014-08-30 MED ORDER — INSULIN DETEMIR 100 UNIT/ML FLEXPEN: 20.0000 [IU] | PEN_INJECTOR | Freq: Every day | SUBCUTANEOUS | Status: DC

## 2014-08-30 NOTE — Telephone Encounter (Signed)
Patient states you sent Levemir and NovoLog vials to pharmacy, she uses the pens and wants you to send them/dr

## 2014-12-17 ENCOUNTER — Telehealth: Payer: Self-pay

## 2014-12-17 NOTE — Telephone Encounter (Signed)
I have not seen this patient since February 22. At that time she indicated her plans to transfer to a practice in [place] due to transportation issues. I have recommended [facility] and subsequently take the same insurance that we day. Therefore, if she still intends to come here I need her to make an appointment for a follow-up before I will give her an order for mammogram.

## 2014-12-17 NOTE — Telephone Encounter (Signed)
Patient needs mammogram ordered and scheduled.dr

## 2014-12-18 NOTE — Telephone Encounter (Signed)
Patient told to schedule appointment.dr

## 2014-12-19 ENCOUNTER — Encounter: Payer: Self-pay | Admitting: Internal Medicine

## 2014-12-19 ENCOUNTER — Ambulatory Visit (INDEPENDENT_AMBULATORY_CARE_PROVIDER_SITE_OTHER): Payer: Medicaid Other | Admitting: Internal Medicine

## 2014-12-19 VITALS — BP 140/70 | HR 84 | Ht 63.0 in | Wt 101.6 lb

## 2014-12-19 DIAGNOSIS — IMO0002 Reserved for concepts with insufficient information to code with codable children: Secondary | ICD-10-CM | POA: Insufficient documentation

## 2014-12-19 DIAGNOSIS — E1140 Type 2 diabetes mellitus with diabetic neuropathy, unspecified: Secondary | ICD-10-CM | POA: Insufficient documentation

## 2014-12-19 DIAGNOSIS — N63 Unspecified lump in breast: Secondary | ICD-10-CM

## 2014-12-19 DIAGNOSIS — E11319 Type 2 diabetes mellitus with unspecified diabetic retinopathy without macular edema: Secondary | ICD-10-CM | POA: Insufficient documentation

## 2014-12-19 DIAGNOSIS — E118 Type 2 diabetes mellitus with unspecified complications: Secondary | ICD-10-CM | POA: Insufficient documentation

## 2014-12-19 DIAGNOSIS — E1165 Type 2 diabetes mellitus with hyperglycemia: Secondary | ICD-10-CM

## 2014-12-19 DIAGNOSIS — N6320 Unspecified lump in the left breast, unspecified quadrant: Secondary | ICD-10-CM

## 2014-12-19 DIAGNOSIS — I739 Peripheral vascular disease, unspecified: Secondary | ICD-10-CM | POA: Diagnosis not present

## 2014-12-19 DIAGNOSIS — Z794 Long term (current) use of insulin: Secondary | ICD-10-CM | POA: Diagnosis not present

## 2014-12-19 DIAGNOSIS — F331 Major depressive disorder, recurrent, moderate: Secondary | ICD-10-CM | POA: Diagnosis not present

## 2014-12-19 DIAGNOSIS — E1142 Type 2 diabetes mellitus with diabetic polyneuropathy: Secondary | ICD-10-CM | POA: Insufficient documentation

## 2014-12-19 MED ORDER — MIRTAZAPINE 15 MG PO TABS: 15.0000 mg | ORAL_TABLET | Freq: Every day | ORAL | Status: DC

## 2014-12-19 NOTE — Progress Notes (Signed)
Date:  12/19/2014   Name:  Sabrina Wells   DOB:  1960/06/07   MRN:  [id number]   Chief Complaint: Breast Mass  Patient notes onset of a mass in her left breast about a week ago. It was slightly tender which called it to her attention. She's had fibrocystic changes for number of years and has been advised in the past to avoid caffeine. She admits that she has not had a mammogram in a number of years. Depression - patient has had some recent stresses. Her fianc committed suicide. Her fianc's son committed suicide 3 months before that. She does have the support of friends but his father so stressed with decreased appetite and trouble with sleep. She's currently on Zoloft 50 mg. She's been on number of antidepressants in the past including Prozac, Effexor, Cymbalta. Diabetes uncontrolled - with multiple complications. She is on Levemir twice a day with NovoLog pre-meal. Her morning sugars run in the 140-150 range but evening blood sugars up to 250. She is very hesitant to take higher dose of NovoLog pre-meal because she tends to drop low. She admits to eating poorly and not eating more than 2 meals per day. Her last A1c was 8.5 9 months ago.  Review of Systems  Constitutional: Positive for fatigue. Negative for fever and appetite change.  Respiratory: Negative for choking, chest tightness and shortness of breath.   Cardiovascular: Negative for chest pain and palpitations.  Endocrine: Negative for polydipsia and polyuria.  Genitourinary: Negative for dysuria.  Skin:       Left breast mass  Neurological: Positive for numbness. Negative for dizziness and light-headedness.  Hematological: Negative for adenopathy.  Psychiatric/Behavioral: Positive for sleep disturbance and dysphoric mood. Negative for suicidal ideas. The patient is nervous/anxious.     Patient Active Problem List   Diagnosis Date Noted  . PVD (peripheral vascular disease) ([place]) 12/19/2014  . Uncontrolled type 2 diabetes mellitus  with retinopathy, with long-term current use of insulin ([place]) 12/19/2014  . Diabetic polyneuropathy associated with type 2 diabetes mellitus ([place]) 12/19/2014  . Gastroparesis 07/20/2014  . Multiple pulmonary nodules determined by computed tomography of lung 07/20/2014  . Splenic infarct 07/17/2014  . Intractable abdominal pain 07/17/2014  . Essential hypertension 07/17/2014  . Hyperlipidemia 07/17/2014  . DM type 2, uncontrolled, with neuropathy ([place]) 07/17/2014  . GAD (generalized anxiety disorder) 06/08/2014  . MDD (major depressive disorder), recurrent episode, moderate ([place])     Prior to Admission medications   Medication Sig Start Date End Date Taking? Authorizing Provider  gabapentin (NEURONTIN) 300 MG capsule Take 300 mg by mouth 3 (three) times daily as needed (for leg pain).    Yes Historical Provider, MD  hydrOXYzine (ATARAX/VISTARIL) 10 MG tablet Take 1 tablet (10 mg total) by mouth 3 (three) times daily as needed. 06/08/14  Yes Carrie Mew, MD  insulin aspart (NOVOLOG FLEXPEN) 100 UNIT/ML FlexPen Inject 10 Units into the skin 3 (three) times daily with meals. 08/30/14  Yes Glean Hess, MD  Insulin Detemir (LEVEMIR FLEXTOUCH) 100 UNIT/ML Pen Inject 20 Units into the skin daily. 12 units in the AM and 8 units in PM 08/30/14  Yes Glean Hess, MD  lisinopril (PRINIVIL,ZESTRIL) 10 MG tablet Take 10 mg by mouth daily.   Yes Historical Provider, MD  sertraline (ZOLOFT) 50 MG tablet Take 50 mg by mouth daily.   Yes Historical Provider, MD    Allergies  Allergen Reactions  . Penicillins Hives    Past Surgical History  Procedure Laterality Date  . Tubal ligation    . Vascular stent Right 2012    Lower leg  . Colonscopy  2012    polyps    Social History  Substance Use Topics  . Smoking status: Current Every Day Smoker    Types: Cigarettes  . Smokeless tobacco: None  . Alcohol Use: 1.2 oz/week    2 Standard drinks or equivalent per week    Medication list has  been reviewed and updated.   Physical Exam  Constitutional: She appears well-developed. She appears distressed.  Neck: Normal range of motion. Neck supple.  Cardiovascular: Normal rate, regular rhythm and normal heart sounds.   Pulmonary/Chest: Effort normal. She has decreased breath sounds. She has no wheezes. She has no rhonchi. Right breast exhibits no mass, no nipple discharge, no skin change and no tenderness. Left breast exhibits mass and tenderness. Left breast exhibits no nipple discharge and no skin change.    Abdominal: Soft. Normal appearance.  Musculoskeletal: She exhibits no edema.  Lymphadenopathy:    She has no cervical adenopathy.    She has no axillary adenopathy.  Neurological: She is alert.  Skin: Skin is warm and dry. No erythema.  Psychiatric: Her speech is normal. Her mood appears anxious. Cognition and memory are normal. She exhibits a depressed mood.  Nursing note and vitals reviewed.   BP 140/70 mmHg  Pulse 84  Ht '5\' 3"'$  (1.6 m)  Wt 101 lb 9.6 oz (46.085 kg)  BMI 18.00 kg/m2  Assessment and Plan: 1. Breast mass, left Patient referred for diagnostic mammogram and ultrasound - US BREAST LTD UNI RIGHT INC AXILLA; Future - US BREAST LTD UNI LEFT INC AXILLA; Future - MM Digital Diagnostic Bilat; Future  2. MDD (major depressive disorder), recurrent episode, moderate ([place]) Continue Zoloft; add Remeron at bedtime - mirtazapine (REMERON) 15 MG tablet; Take 1 tablet (15 mg total) by mouth at bedtime.  Dispense: 30 tablet; Refill: 3  3. Uncontrolled type 2 diabetes mellitus with retinopathy, with long-term current use of insulin, macular edema presence unspecified, unspecified retinopathy severity ([place]) Continue current insulin therapy Return for follow-up in the near future  4. Diabetic polyneuropathy associated with type 2 diabetes mellitus ([facility]) Stable symptoms  5. PVD (peripheral vascular disease) ([place]) Patient continues to smoke; she strongly  encouraged to cut back and quit completely if possible   Halina Maidens, MD [facility]  12/19/2014

## 2015-01-09 ENCOUNTER — Other Ambulatory Visit: Payer: Self-pay | Admitting: *Deleted

## 2015-01-09 ENCOUNTER — Inpatient Hospital Stay
Admission: RE | Admit: 2015-01-09 | Discharge: 2015-01-09 | Disposition: A | Discharge status: Home / Self Care | Payer: Self-pay | Source: Ambulatory Visit | Attending: *Deleted | Admitting: *Deleted

## 2015-01-09 DIAGNOSIS — Z9289 Personal history of other medical treatment: Secondary | ICD-10-CM

## 2015-01-21 ENCOUNTER — Other Ambulatory Visit: Payer: Self-pay

## 2015-01-21 ENCOUNTER — Ambulatory Visit: Payer: Medicaid Other

## 2015-01-31 ENCOUNTER — Other Ambulatory Visit: Payer: Self-pay

## 2015-01-31 ENCOUNTER — Ambulatory Visit: Payer: Medicaid Other

## 2015-02-02 DIAGNOSIS — N63 Unspecified lump in unspecified breast: Secondary | ICD-10-CM

## 2015-02-02 HISTORY — PX: BREAST BIOPSY: SHX20

## 2015-02-02 HISTORY — DX: Unspecified lump in unspecified breast: N63.0

## 2015-02-19 ENCOUNTER — Other Ambulatory Visit: Payer: Self-pay

## 2015-02-19 ENCOUNTER — Ambulatory Visit: Admission: RE | Admit: 2015-02-19 | Payer: Medicaid Other | Source: Ambulatory Visit

## 2015-03-13 ENCOUNTER — Emergency Department
Admission: EM | Admit: 2015-03-13 | Discharge: 2015-03-13 | Disposition: A | Discharge status: Home / Self Care | Payer: Medicaid Other | Attending: Emergency Medicine | Admitting: Emergency Medicine

## 2015-03-13 ENCOUNTER — Ambulatory Visit (HOSPITAL_COMMUNITY)
Admission: EM | Admit: 2015-03-13 | Discharge: 2015-03-13 | Disposition: A | Discharge status: Home / Self Care | Payer: No Typology Code available for payment source | Source: Ambulatory Visit | Attending: Emergency Medicine | Admitting: Emergency Medicine

## 2015-03-13 DIAGNOSIS — Y9289 Other specified places as the place of occurrence of the external cause: Secondary | ICD-10-CM | POA: Insufficient documentation

## 2015-03-13 DIAGNOSIS — E1140 Type 2 diabetes mellitus with diabetic neuropathy, unspecified: Secondary | ICD-10-CM | POA: Diagnosis not present

## 2015-03-13 DIAGNOSIS — Y998 Other external cause status: Secondary | ICD-10-CM | POA: Diagnosis not present

## 2015-03-13 DIAGNOSIS — Y9389 Activity, other specified: Secondary | ICD-10-CM | POA: Diagnosis not present

## 2015-03-13 DIAGNOSIS — Z880 Allergy status to penicillin: Secondary | ICD-10-CM | POA: Diagnosis not present

## 2015-03-13 DIAGNOSIS — I10 Essential (primary) hypertension: Secondary | ICD-10-CM | POA: Diagnosis not present

## 2015-03-13 DIAGNOSIS — Z0000 Encounter for general adult medical examination without abnormal findings: Secondary | ICD-10-CM | POA: Insufficient documentation

## 2015-03-13 DIAGNOSIS — Z79899 Other long term (current) drug therapy: Secondary | ICD-10-CM | POA: Insufficient documentation

## 2015-03-13 DIAGNOSIS — Z794 Long term (current) use of insulin: Secondary | ICD-10-CM | POA: Insufficient documentation

## 2015-03-13 DIAGNOSIS — F17210 Nicotine dependence, cigarettes, uncomplicated: Secondary | ICD-10-CM | POA: Diagnosis not present

## 2015-03-13 DIAGNOSIS — Z0441 Encounter for examination and observation following alleged adult rape: Secondary | ICD-10-CM | POA: Insufficient documentation

## 2015-03-13 DIAGNOSIS — E11319 Type 2 diabetes mellitus with unspecified diabetic retinopathy without macular edema: Secondary | ICD-10-CM | POA: Diagnosis not present

## 2015-03-13 DIAGNOSIS — T7421XA Adult sexual abuse, confirmed, initial encounter: Secondary | ICD-10-CM | POA: Diagnosis present

## 2015-03-13 DIAGNOSIS — Z139 Encounter for screening, unspecified: Secondary | ICD-10-CM

## 2015-03-13 LAB — GLUCOSE, CAPILLARY
GLUCOSE-CAPILLARY: 235 mg/dL — AB (ref 65–99)
GLUCOSE-CAPILLARY: 216 mg/dL — AB (ref 65–99)

## 2015-03-13 MED ORDER — LORAZEPAM 1 MG PO TABS
1.0000 mg | ORAL_TABLET | Freq: Once | ORAL | Status: AC
  Administered 2015-03-13: 1 mg via ORAL
  Filled 2015-03-13: qty 1

## 2015-03-13 MED ORDER — ONDANSETRON 4 MG PO TBDP
4.0000 mg | ORAL_TABLET | Freq: Once | ORAL | Status: AC
  Administered 2015-03-13: 4 mg via ORAL
  Filled 2015-03-13: qty 1

## 2015-03-13 MED ORDER — LORAZEPAM 0.5 MG PO TABS: 0.5000 mg | ORAL_TABLET | Freq: Four times a day (QID) | ORAL | Status: DC | PRN

## 2015-03-13 MED ORDER — INSULIN DETEMIR 100 UNIT/ML ~~LOC~~ SOLN
10.0000 [IU] | SUBCUTANEOUS | Status: AC
  Administered 2015-03-13: 10 [IU] via SUBCUTANEOUS
  Filled 2015-03-13: qty 0.1

## 2015-03-13 MED ORDER — AZITHROMYCIN 500 MG PO TABS
2000.0000 mg | ORAL_TABLET | Freq: Once | ORAL | Status: AC
  Administered 2015-03-13: 2000 mg via ORAL
  Filled 2015-03-13: qty 4

## 2015-03-13 MED ORDER — METRONIDAZOLE 500 MG PO TABS
2000.0000 mg | ORAL_TABLET | Freq: Once | ORAL | Status: AC
  Administered 2015-03-13: 2000 mg via ORAL
  Filled 2015-03-13: qty 4
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
## 2015-03-19 ENCOUNTER — Encounter: Payer: Self-pay | Admitting: Internal Medicine

## 2015-04-04 ENCOUNTER — Ambulatory Visit (INDEPENDENT_AMBULATORY_CARE_PROVIDER_SITE_OTHER): Payer: Medicaid Other | Admitting: Internal Medicine

## 2015-04-04 ENCOUNTER — Encounter: Payer: Self-pay | Admitting: Internal Medicine

## 2015-04-04 VITALS — BP 132/64 | HR 104 | Ht 63.0 in | Wt 107.4 lb

## 2015-04-04 DIAGNOSIS — E1165 Type 2 diabetes mellitus with hyperglycemia: Secondary | ICD-10-CM

## 2015-04-04 DIAGNOSIS — F331 Major depressive disorder, recurrent, moderate: Secondary | ICD-10-CM | POA: Diagnosis not present

## 2015-04-04 DIAGNOSIS — I10 Essential (primary) hypertension: Secondary | ICD-10-CM | POA: Diagnosis not present

## 2015-04-04 DIAGNOSIS — Z794 Long term (current) use of insulin: Secondary | ICD-10-CM

## 2015-04-04 DIAGNOSIS — Z202 Contact with and (suspected) exposure to infections with a predominantly sexual mode of transmission: Secondary | ICD-10-CM

## 2015-04-04 DIAGNOSIS — F4310 Post-traumatic stress disorder, unspecified: Secondary | ICD-10-CM

## 2015-04-04 DIAGNOSIS — E11311 Type 2 diabetes mellitus with unspecified diabetic retinopathy with macular edema: Secondary | ICD-10-CM

## 2015-04-04 MED ORDER — LORAZEPAM 0.5 MG PO TABS: 0.5000 mg | ORAL_TABLET | Freq: Three times a day (TID) | ORAL | Status: DC | PRN
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
## 2015-04-05 LAB — HIV ANTIBODY (ROUTINE TESTING W REFLEX): HIV SCREEN 4TH GENERATION: NONREACTIVE

## 2015-04-05 LAB — CBC WITH DIFFERENTIAL/PLATELET
BASOS ABS: 0.1 10*3/uL (ref 0.0–0.2)
BASOS: 1 %
EOS (ABSOLUTE): 0.3 10*3/uL (ref 0.0–0.4)
Eos: 2 %
Hematocrit: 40.8 % (ref 34.0–46.6)
Hemoglobin: 13.6 g/dL (ref 11.1–15.9)
IMMATURE GRANS (ABS): 0 10*3/uL (ref 0.0–0.1)
IMMATURE GRANULOCYTES: 0 %
LYMPHS: 22 %
Lymphocytes Absolute: 2.3 10*3/uL (ref 0.7–3.1)
MCH: 30 pg (ref 26.6–33.0)
MCHC: 33.3 g/dL (ref 31.5–35.7)
MCV: 90 fL (ref 79–97)
Monocytes Absolute: 0.8 10*3/uL (ref 0.1–0.9)
Monocytes: 8 %
NEUTROS PCT: 67 %
Neutrophils Absolute: 6.8 10*3/uL (ref 1.4–7.0)
PLATELETS: 376 10*3/uL (ref 150–379)
RBC: 4.54 x10E6/uL (ref 3.77–5.28)
RDW: 13.7 % (ref 12.3–15.4)
WBC: 10.3 10*3/uL (ref 3.4–10.8)

## 2015-04-05 LAB — COMPREHENSIVE METABOLIC PANEL
A/G RATIO: 1.8 (ref 1.1–2.5)
ALK PHOS: 84 IU/L (ref 39–117)
ALT: 8 IU/L (ref 0–32)
AST: 14 IU/L (ref 0–40)
Albumin: 3.9 g/dL (ref 3.5–5.5)
BUN/Creatinine Ratio: 20 (ref 9–23)
BUN: 12 mg/dL (ref 6–24)
Bilirubin Total: <0.2 mg/dL (ref 0.0–1.2)
CALCIUM: 8.9 mg/dL (ref 8.7–10.2)
CHLORIDE: 97 mmol/L (ref 96–106)
CO2: 22 mmol/L (ref 18–29)
Creatinine, Ser: 0.61 mg/dL (ref 0.57–1.00)
GFR calc Af Amer: 117 mL/min/{1.73_m2} (ref >59)
GFR, EST NON AFRICAN AMERICAN: 102 mL/min/{1.73_m2} (ref >59)
Globulin, Total: 2.2 g/dL (ref 1.5–4.5)
Glucose: 149 mg/dL — ABNORMAL HIGH (ref 65–99)
POTASSIUM: 4.7 mmol/L (ref 3.5–5.2)
Sodium: 137 mmol/L (ref 134–144)
Total Protein: 6.1 g/dL (ref 6.0–8.5)

## 2015-04-05 LAB — HEMOGLOBIN A1C
ESTIMATED AVERAGE GLUCOSE: 212 mg/dL
HEMOGLOBIN A1C: 9 % — AB (ref 4.8–5.6)

## 2015-04-07 ENCOUNTER — Telehealth: Payer: Self-pay

## 2015-04-07 NOTE — Telephone Encounter (Signed)
Spoke with patient. Patient advised of all results and verbalized understanding. Will call back with any future questions or concerns. MA[facility]  

## 2015-04-07 NOTE — Telephone Encounter (Signed)
-----   Message from Glean Hess, MD sent at 04/05/2015  2:00 PM EST ----- Labs are normal except for A1C of 9.0.  HIV is negative.

## 2015-05-02 ENCOUNTER — Other Ambulatory Visit: Payer: Self-pay

## 2015-05-02 MED ORDER — GLUCOSE BLOOD VI STRP: ORAL_STRIP | Status: DC

## 2015-05-02 NOTE — Telephone Encounter (Signed)
Patient called in today and states that she has no test strips left and would like to know if you could send these in.

## 2015-06-16 ENCOUNTER — Encounter: Payer: Self-pay | Admitting: Internal Medicine

## 2015-07-27 IMAGING — CT CT CHEST W/O CM
1 series · 15 of 33 positions shown, 19 images · non-contrast
Comparison: CT abdomen pelvis 07/17/2014.

CLINICAL DATA: Abdominal pain with nausea and vomiting for 2 days.
Smoker, evaluate for malignancy.

EXAM:
CT CHEST WITHOUT CONTRAST
TECHNIQUE: Multidetector CT imaging of the chest was performed following the
standard protocol without IV contrast.

[Series 2: routine chest wo · axial · 0.58mm/px · z∈[-38,+202]mm · 15 of 58 slices shown, 19 images]
[im 5/58  mediastinal]
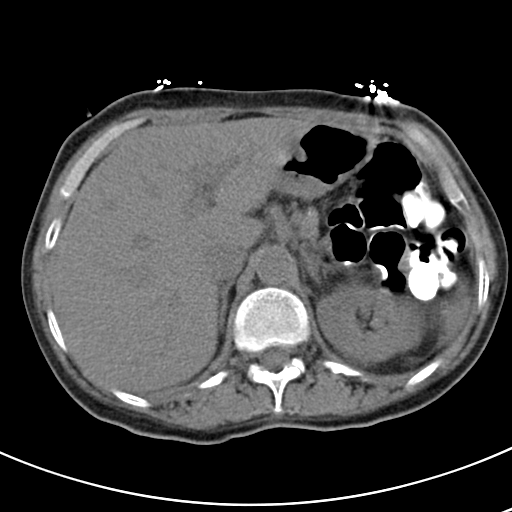
[im 5/58  lung]
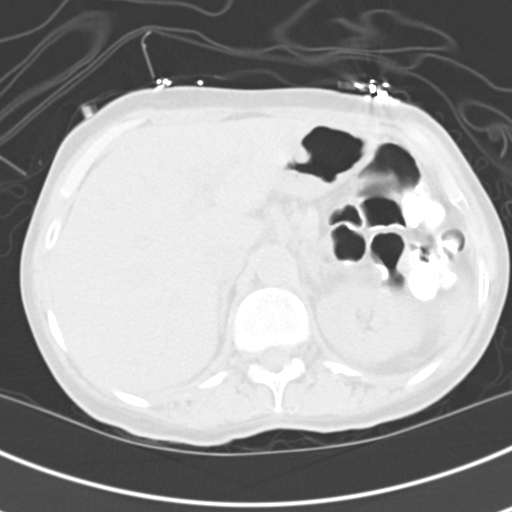
[im 9/58  lung]
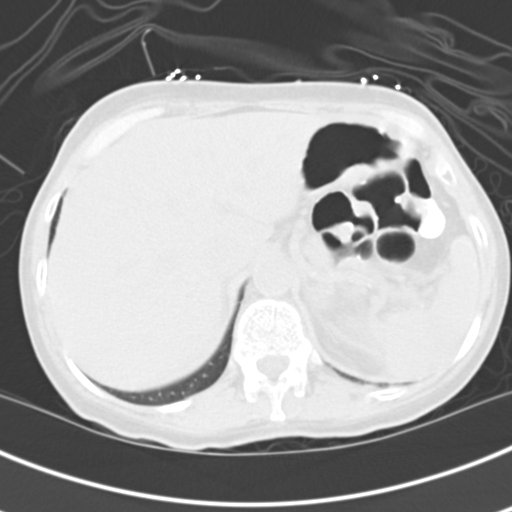
[im 12/58  lung]
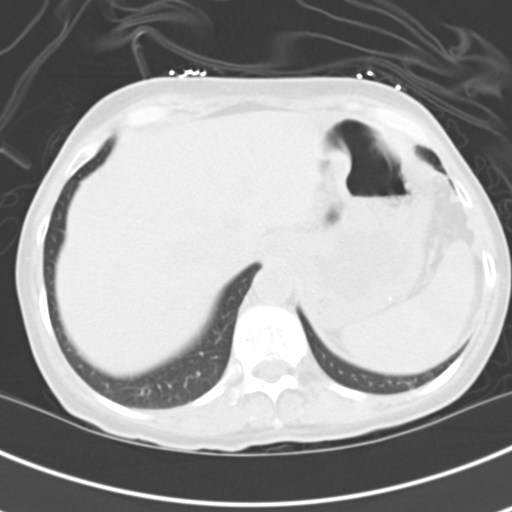
[im 15/58  lung]
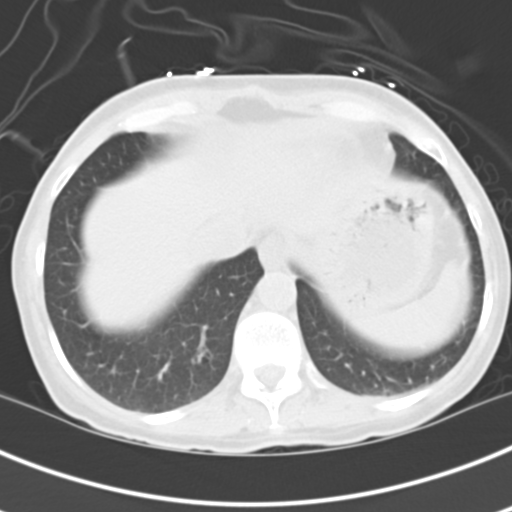
[im 20/58  mediastinal]
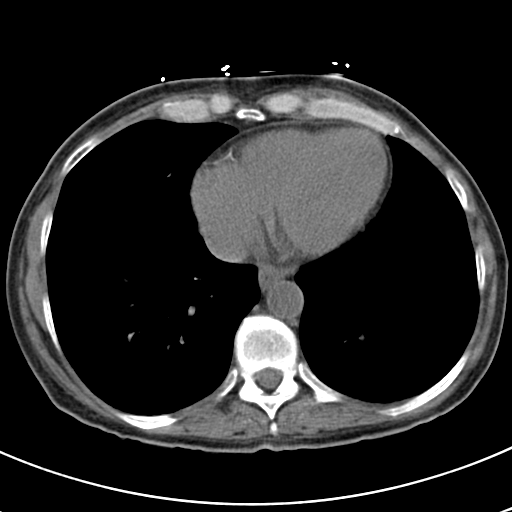
[im 20/58  lung]
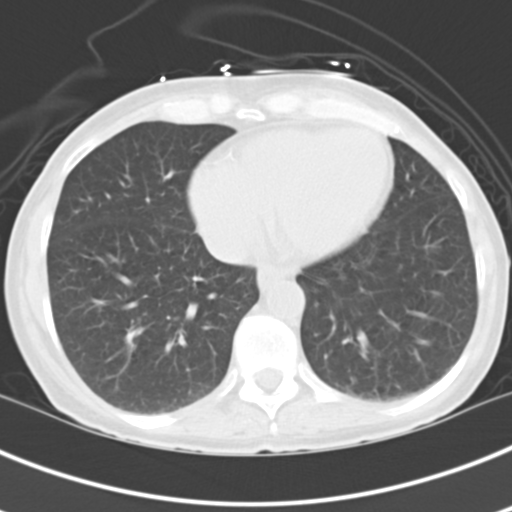
[im 23/58  lung]
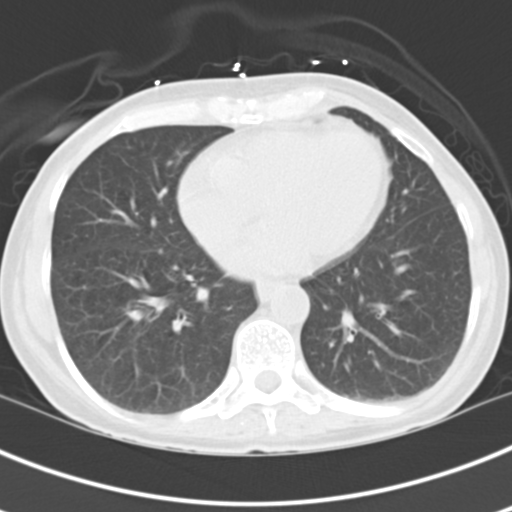
[im 26/58  lung]
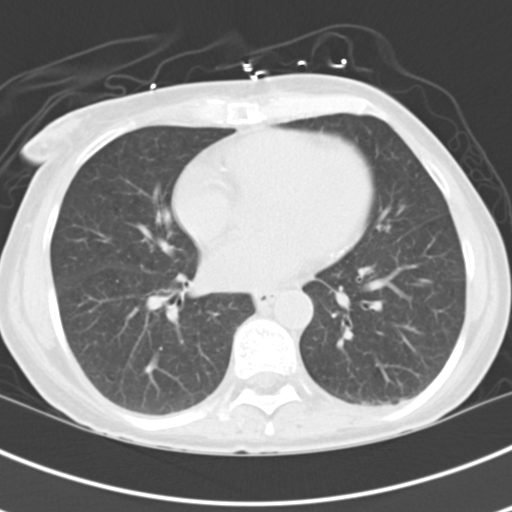
[im 30/58  lung]
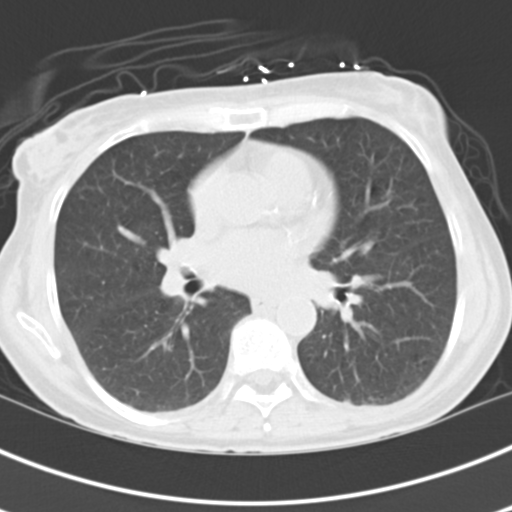
[im 32/58  mediastinal]
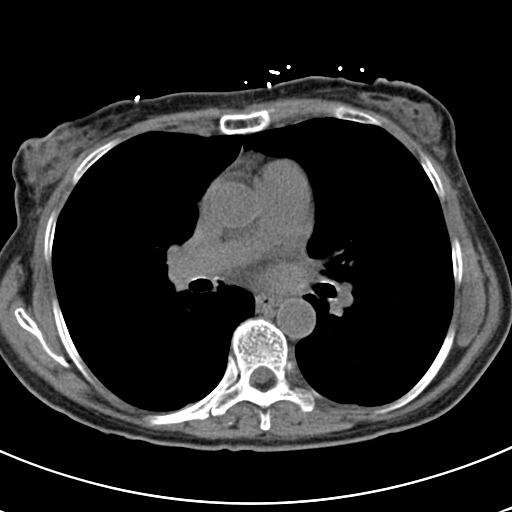
[im 32/58  lung]
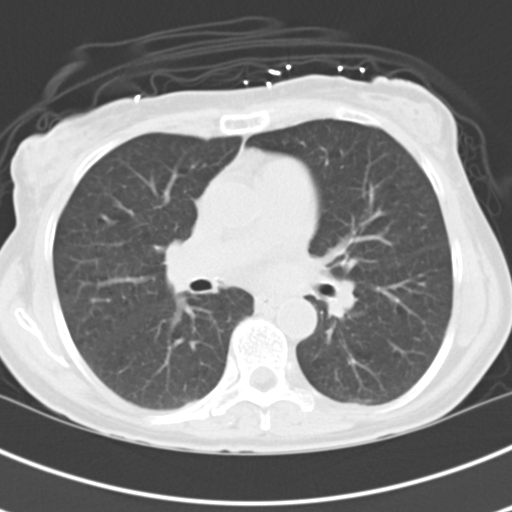
[im 35/58  lung]
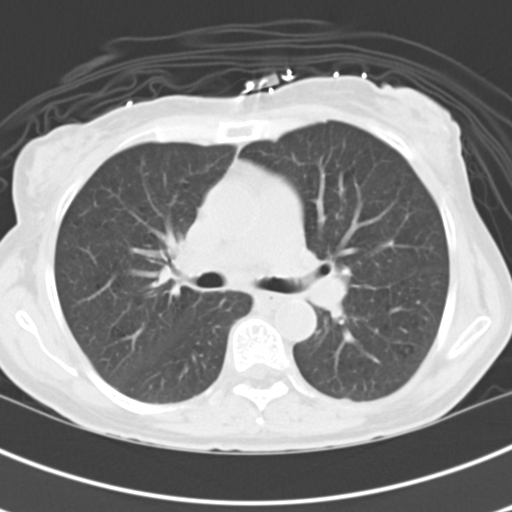
[im 39/58  lung]
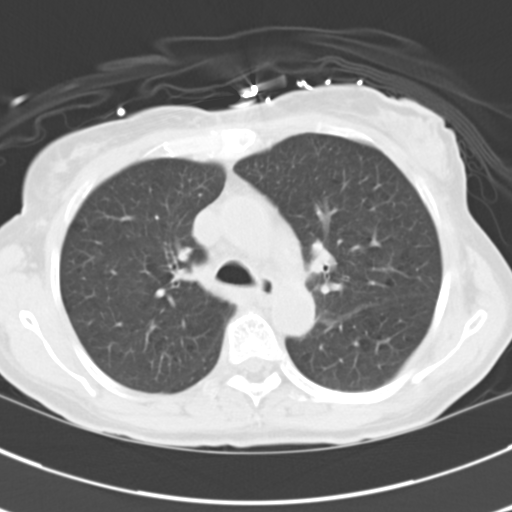
[im 43/58  lung]
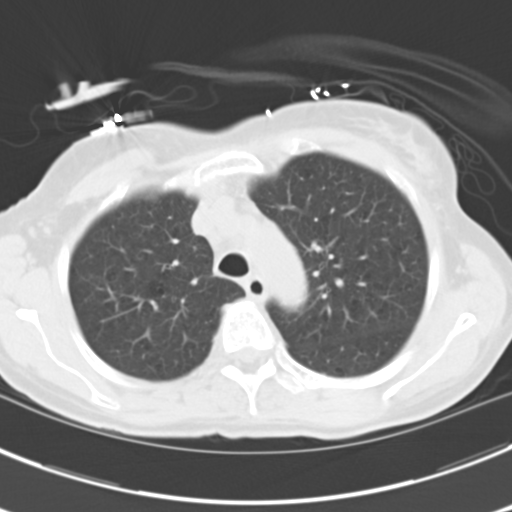
[im 46/58  mediastinal]
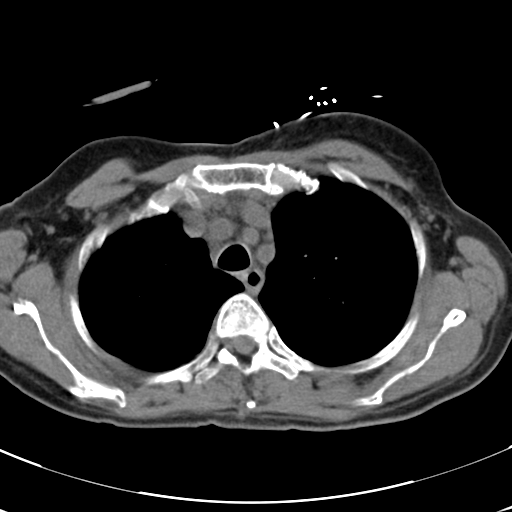
[im 46/58  lung]
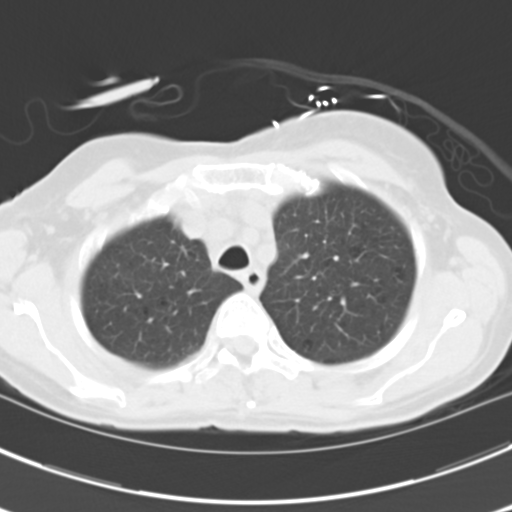
[im 49/58  lung]
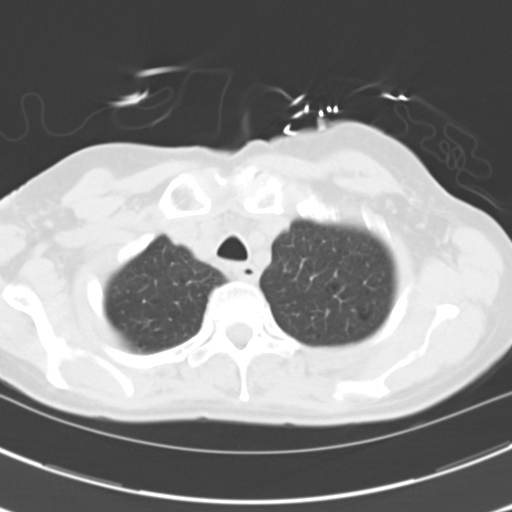
[im 53/58  lung]
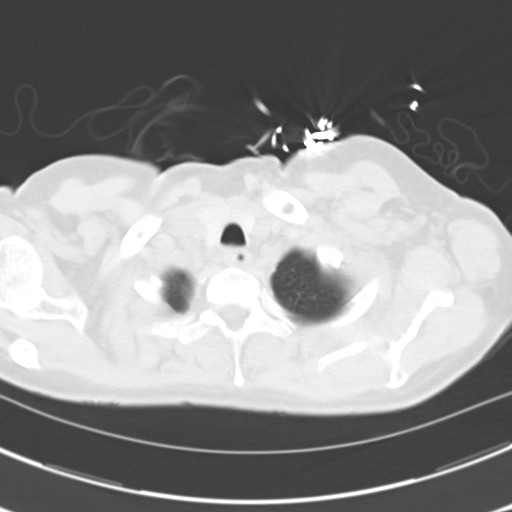

[15 of 33 positions shown; findings below may reference images not displayed]

FINDINGS: Mediastinum/Nodes: Mediastinal lymph nodes not enlarged by CT size
criteria. Hilar regions are difficult to definitively evaluate
without IV contrast. No axillary adenopathy. Three-vessel coronary
artery calcification. Heart size normal. No pericardial effusion.

Lungs/Pleura: Moderate centrilobular emphysema. There are a few
scattered tiny pulmonary nodules in the upper lobes, measuring up to
2 mm. Lungs are otherwise clear. No pleural fluid. Airway is
unremarkable.

Upper abdomen: Visualized portions of the liver, adrenal glands and
left kidney are grossly unremarkable. Splenic infarct seen on
07/17/2014 is poorly visualized. Visualized portions of the stomach
and bowel are grossly unremarkable. No upper abdominal adenopathy.

Musculoskeletal: No worrisome lytic or sclerotic lesions.
IMPRESSION: 1. A few scattered tiny bilateral pulmonary nodules. Given risk
factors for bronchogenic carcinoma, follow-up chest CT at 1 year is
recommended. This recommendation follows the consensus statement:
Guidelines for Management of Small Pulmonary Nodules Detected on CT
Scans: A Statement from the [HOSPITAL] as published in
2. Three-vessel coronary artery calcification.
3. Splenic infarct seen on 07/17/2014 is poorly visualized.

## 2015-07-31 ENCOUNTER — Encounter: Payer: Self-pay | Admitting: Emergency Medicine

## 2015-07-31 ENCOUNTER — Emergency Department
Admission: EM | Admit: 2015-07-31 | Discharge: 2015-07-31 | Disposition: A | Discharge status: Home / Self Care | Payer: Medicaid Other | Attending: Emergency Medicine | Admitting: Emergency Medicine

## 2015-07-31 DIAGNOSIS — K047 Periapical abscess without sinus: Secondary | ICD-10-CM | POA: Insufficient documentation

## 2015-07-31 DIAGNOSIS — E785 Hyperlipidemia, unspecified: Secondary | ICD-10-CM | POA: Diagnosis not present

## 2015-07-31 DIAGNOSIS — E1151 Type 2 diabetes mellitus with diabetic peripheral angiopathy without gangrene: Secondary | ICD-10-CM | POA: Diagnosis not present

## 2015-07-31 DIAGNOSIS — F1290 Cannabis use, unspecified, uncomplicated: Secondary | ICD-10-CM | POA: Insufficient documentation

## 2015-07-31 DIAGNOSIS — Z794 Long term (current) use of insulin: Secondary | ICD-10-CM | POA: Diagnosis not present

## 2015-07-31 DIAGNOSIS — F17210 Nicotine dependence, cigarettes, uncomplicated: Secondary | ICD-10-CM | POA: Insufficient documentation

## 2015-07-31 DIAGNOSIS — E11319 Type 2 diabetes mellitus with unspecified diabetic retinopathy without macular edema: Secondary | ICD-10-CM | POA: Insufficient documentation

## 2015-07-31 DIAGNOSIS — F331 Major depressive disorder, recurrent, moderate: Secondary | ICD-10-CM | POA: Insufficient documentation

## 2015-07-31 DIAGNOSIS — Z79899 Other long term (current) drug therapy: Secondary | ICD-10-CM | POA: Insufficient documentation

## 2015-07-31 DIAGNOSIS — I10 Essential (primary) hypertension: Secondary | ICD-10-CM | POA: Insufficient documentation

## 2015-07-31 MED ORDER — ONDANSETRON 4 MG PO TBDP
ORAL_TABLET | ORAL | Status: AC
  Administered 2015-07-31: 4 mg via ORAL
  Filled 2015-07-31: qty 1

## 2015-07-31 MED ORDER — OXYCODONE-ACETAMINOPHEN 5-325 MG PO TABS
1.0000 | ORAL_TABLET | Freq: Once | ORAL | Status: AC
  Administered 2015-07-31: 1 via ORAL
  Filled 2015-07-31: qty 1

## 2015-07-31 MED ORDER — CLINDAMYCIN HCL 150 MG PO CAPS
300.0000 mg | ORAL_CAPSULE | Freq: Once | ORAL | Status: AC
  Administered 2015-07-31: 300 mg via ORAL
  Filled 2015-07-31: qty 2

## 2015-07-31 MED ORDER — OXYCODONE-ACETAMINOPHEN 5-325 MG PO TABS: 1.0000 | ORAL_TABLET | Freq: Four times a day (QID) | ORAL | Status: DC | PRN

## 2015-07-31 MED ORDER — CLINDAMYCIN HCL 300 MG PO CAPS: 300.0000 mg | ORAL_CAPSULE | Freq: Three times a day (TID) | ORAL | Status: DC

## 2015-07-31 MED ORDER — ONDANSETRON 4 MG PO TBDP
4.0000 mg | ORAL_TABLET | Freq: Once | ORAL | Status: AC
  Administered 2015-07-31: 4 mg via ORAL

## 2015-07-31 MED ORDER — LIDOCAINE-EPINEPHRINE 2 %-1:100000 IJ SOLN
1.7000 mL | Freq: Once | INTRAMUSCULAR | Status: AC
  Administered 2015-07-31: 1.7 mL via INTRADERMAL
  Filled 2015-07-31: qty 1.7

## 2015-07-31 NOTE — ED Notes (Signed)
Pt. States she was eating pretzels Monday night when one pierced top of mouth.  Pt. Now has abscess to top of inside of mouth.

## 2015-07-31 NOTE — Discharge Instructions (Signed)
Dental Abscess A dental abscess is a collection of pus in or around a tooth. CAUSES This condition is caused by a bacterial infection around the root of the tooth that involves the inner part of the tooth (pulp). It may result from:  Severe tooth decay.  Trauma to the tooth that allows bacteria to enter into the pulp, such as a broken or chipped tooth.  Severe gum disease around a tooth. SYMPTOMS Symptoms of this condition include:  Severe pain in and around the infected tooth.  Swelling and redness around the infected tooth, in the mouth, or in the face.  Tenderness.  Pus drainage.  Bad breath.  Bitter taste in the mouth.  Difficulty swallowing.  Difficulty opening the mouth.  Nausea.  Vomiting.  Chills.  Swollen neck glands.  Fever. DIAGNOSIS This condition is diagnosed with examination of the infected tooth. During the exam, your dentist may tap on the infected tooth. Your dentist will also ask about your medical and dental history and may order X-rays. TREATMENT This condition is treated by eliminating the infection. This may be done with:  Antibiotic medicine.  A root canal. This may be performed to save the tooth.  Pulling (extracting) the tooth. This may also involve draining the abscess. This is done if the tooth cannot be saved. HOME CARE INSTRUCTIONS  Take medicines only as directed by your dentist.  If you were prescribed antibiotic medicine, finish all of it even if you start to feel better.  Rinse your mouth (gargle) often with salt water to relieve pain or swelling.  Do not drive or operate heavy machinery while taking pain medicine.  Do not apply heat to the outside of your mouth.  Keep all follow-up visits as directed by your dentist. This is important. SEEK MEDICAL CARE IF:  Your pain is worse and is not helped by medicine. SEEK IMMEDIATE MEDICAL CARE IF:  You have a fever or chills.  Your symptoms suddenly get worse.  You have a  very bad headache.  You have problems breathing or swallowing.  You have trouble opening your mouth.  You have swelling in your neck or around your eye.   This information is not intended to replace advice given to you by your health care provider. Make sure you discuss any questions you have with your health care provider.   Document Released: 01/18/2005 Document Revised: 06/04/2014 Document Reviewed: 01/15/2014 Elsevier Interactive Patient Education 2016 [employer]  [place] [facility] Health and [place] [email or web address] [phone])  [facility] [phone])  [place] [phone])  [place] [phone])  [facility] [phone]) This clinic caters to the indigent population and is on a lottery system. Location: [facility], [place], [place], [place] [facility] Hours: Wednesdays from 6pm - 9pm, patients seen by a lottery system. For dates, call or go to [email or web address] Services: Cleanings, fillings and simple extractions. Payment Options: DENTAL WORK IS FREE OF CHARGE. Bring proof of income or support. Best way to get seen: Arrive at 5:15 pm - this is a lottery, NOT first come/first serve, so arriving earlier will not increase your chances of being seen.     [facility] [phone] Select option 1 for emergencies   Location: [facility], [place], [place], [place] Clinic Hours: No walk-ins  accepted - call the day before to schedule an appointment. Check in times are 9:30 am and 1:30 pm. Services: Simple extractions, temporary fillings, pulpectomy/pulp debridement, uncomplicated abscess drainage. Payment Options: PAYMENT IS DUE AT THE  TIME OF SERVICE.  Fee is usually $100-200, additional surgical procedures (e.g. abscess drainage) may be extra. Cash, checks, Visa/MasterCard accepted.  Can file Medicaid if patient is covered for dental - patient should call case worker to check. No discount for [facility] patients. Best way to get seen: MUST call the day before and get onto the schedule. Can usually be seen the next 1-2 days. No walk-ins accepted.     [place] [phone]   Location: [place], [place] Clinic Hours: M, W, Th, F 8am or 1:30pm, Tues 9a or 1:30 - first come/first served. Services: Simple extractions, temporary fillings, uncomplicated abscess drainage.  You do not need to be an [facility] resident. Payment Options: PAYMENT IS DUE AT THE TIME OF SERVICE. Dental insurance, otherwise sliding scale - bring proof of income or support. Depending on income and treatment needed, cost is usually $50-200. Best way to get seen: Arrive early as it is first come/first served.     [facility] [phone]   Location: [place] [facility] Hours: Mon-Thu 8a-5p Services: Most basic dental services including extractions and fillings. Payment Options: PAYMENT IS DUE AT THE TIME OF SERVICE. Sliding scale, up to 50% off - bring proof if income or support. Medicaid with dental option accepted. Best way to get seen: Call to schedule an appointment, can usually be seen within 2 weeks OR they will try to see walk-ins - show up at [place] or 2p (you may have to wait).     [facility] [place] RESIDENTS ONLY   Location: [facility], [place] [address], [place], [place] [address] Clinic Hours: By appointment only. Monday - Thursday 8am-5pm, Friday 8am-12pm Services: Cleanings, fillings, extractions. Payment Options: PAYMENT IS DUE AT THE TIME OF  SERVICE. Cash, Visa or MasterCard. Sliding scale - $30 minimum per service. Best way to get seen: Come in to office, complete packet and make an appointment - need proof of income or support monies for each household member and proof of [facility] residence. Usually takes about a month to get in.     [facility] [phone]   Location: [address] [place] [facility] Hours: Walk-in Urgent Care Dental Services are offered Monday-Friday mornings only. The numbers of emergencies accepted daily is limited to the number of providers available. Maximum 15 - Mondays, Wednesdays & Thursdays Maximum 10 - Tuesdays & Fridays Services: You do not need to be a [facility] resident to be seen for a dental emergency. Emergencies are defined as pain, swelling, abnormal bleeding, or dental trauma. Walkins will receive x-rays if needed. NOTE: Dental cleaning is not an emergency. Payment Options: PAYMENT IS DUE AT THE TIME OF SERVICE. Minimum co-pay is $40.00 for uninsured patients. Minimum co-pay is $3.00 for Medicaid with dental coverage. Dental Insurance is accepted and must be presented at time of visit. Medicare does not cover dental. Forms of payment: Cash, credit card, checks. Best way to get seen: If not previously registered with the clinic, walk-in dental registration begins at 7:15 am and is on a first come/first serve basis. If previously registered with the clinic, call to make an appointment.     [facility] [place]   Location: [place] [address], [place], [place] Clinic Hours: Mon-Thu 10a-2p Services: Extractions only! Payment Options: FREE (donations accepted) - bring proof of income or support Best way to get seen: Call and schedule an appointment OR come at 8am on the 1st Monday of every month (except for holidays) when it is first come/first served.     [facility] [phone]    Location: [place], [place] [facility] Hours: Friday mornings Services, Payment Options, Best way to get seen: Call for info   Take antibiotics and pain medicine as directed. Return to the emergency room for any worsening symptoms. Such as fevers, increasing pain or swelling. Or if he cannot take antibiotics. Can try following up with a dentist if you're able.

## 2015-07-31 NOTE — ED Notes (Signed)
Pt reports she has an abscess/ulcer on the roof of her mouth (been there since Tuesday) - she states she ate pretzels Monday and one stabbed into the roof of her mouth and then the following day she woke up with this issue - she states the ulcer/abscess is close to "a bad tooth" and is causing her to have a toothache - pt denies any drainage

## 2015-07-31 NOTE — ED Provider Notes (Signed)
[facility] Emergency Department Provider Note  ____________________________________________  Time seen: Approximately 8:48 PM  I have reviewed the triage vital signs and the nursing notes.   HISTORY  Chief Complaint Abscess    HPI Sabrina Wells is a 55 y.o. female presents with pain in the roof of the mouth behind the front incisor with swelling. Worsened over the last couple days.Has noticed some drainage around the tooth. No fevers or chills. No sore throat. No ear pain. She does have adenopathy on the left.   Past Medical History  Diagnosis Date  . Diabetes mellitus without complication ([place])   . Hypertension   . Depressed     Patient Active Problem List   Diagnosis Date Noted  . PVD (peripheral vascular disease) ([place]) 12/19/2014  . Uncontrolled type 2 diabetes mellitus with retinopathy, with long-term current use of insulin ([place]) 12/19/2014  . Diabetic polyneuropathy associated with type 2 diabetes mellitus ([place]) 12/19/2014  . Gastroparesis 07/20/2014  . Multiple pulmonary nodules determined by computed tomography of lung 07/20/2014  . Splenic infarct 07/17/2014  . Intractable abdominal pain 07/17/2014  . Essential hypertension 07/17/2014  . Hyperlipidemia 07/17/2014  . DM type 2, uncontrolled, with neuropathy ([place]) 07/17/2014  . GAD (generalized anxiety disorder) 06/08/2014  . MDD (major depressive disorder), recurrent episode, moderate ([place])     Past Surgical History  Procedure Laterality Date  . Tubal ligation    . Vascular stent Right 2012    Lower leg  . Colonscopy  2012    polyps    Current Outpatient Rx  Name  Route  Sig  Dispense  Refill  . clindamycin (CLEOCIN) 300 MG capsule   Oral   Take 1 capsule (300 mg total) by mouth 3 (three) times daily.   30 capsule   0   . gabapentin (NEURONTIN) 300 MG capsule   Oral   Take 300 mg by mouth 3 (three) times daily as needed (for leg pain).          Marland Kitchen glucose blood (ACCU-CHEK  AVIVA) test strip      Use as instructed   100 each   12   . insulin aspart (NOVOLOG FLEXPEN) 100 UNIT/ML FlexPen   Subcutaneous   Inject 10 Units into the skin 3 (three) times daily with meals.   15 mL   11   . Insulin Detemir (LEVEMIR FLEXTOUCH) 100 UNIT/ML Pen   Subcutaneous   Inject 20 Units into the skin daily. 12 units in the AM and 8 units in PM   15 mL   11   . lisinopril (PRINIVIL,ZESTRIL) 10 MG tablet   Oral   Take 10 mg by mouth daily.         Marland Kitchen LORazepam (ATIVAN) 0.5 MG tablet   Oral   Take 1 tablet (0.5 mg total) by mouth every 8 (eight) hours as needed for anxiety.   20 tablet   0   . mirtazapine (REMERON) 15 MG tablet   Oral   Take 1 tablet (15 mg total) by mouth at bedtime.   30 tablet   3   . oxyCODONE-acetaminophen (ROXICET) 5-325 MG tablet   Oral   Take 1 tablet by mouth every 6 (six) hours as needed.   10 tablet   0   . sertraline (ZOLOFT) 50 MG tablet   Oral   Take 50 mg by mouth daily.           Allergies Penicillins  Family History  Problem  Relation Age of Onset  . Diabetes Mother   . Hypertension Mother   . Thyroid disease Daughter   . Throat cancer Mother     Social History Social History  Substance Use Topics  . Smoking status: Current Every Day Smoker -- 1.00 packs/day    Types: Cigarettes  . Smokeless tobacco: None  . Alcohol Use: No    Review of Systems Constitutional: No fever/chills Eyes: No visual changes. ENT: No sore throat.Per history of present illness Cardiovascular: Denies chest pain. Respiratory: Denies shortness of breath. Gastrointestinal: No abdominal pain.  No nausea, no vomiting.  Musculoskeletal: Negative for back pain. Skin: Negative for rash. Neurological: Negative for focal weakness or numbness. 10-point ROS otherwise negative.  ____________________________________________   PHYSICAL EXAM:  VITAL SIGNS: ED Triage Vitals  Enc Vitals Group     BP 07/31/15 1901 143/64 mmHg     Pulse  Rate 07/31/15 1901 79     Resp 07/31/15 1901 18     Temp 07/31/15 1901 97.9 F (36.6 C)     Temp Source 07/31/15 1901 Oral     SpO2 07/31/15 1901 99 %     Weight 07/31/15 1901 115 lb (52.164 kg)     Height 07/31/15 1901 '5\' 3"'$  (1.6 m)     Head Cir --      Peak Flow --      Pain Score 07/31/15 1901 10     Pain Loc --      Pain Edu? --      Excl. in [place]? --     Constitutional: Alert and oriented. Well appearing and in no acute distress. Eyes: Conjunctivae are normal. PERRL. EOMI. Ears:  Clear with normal landmarks. No erythema. Head: Atraumatic. Nose: No congestion/rhinnorhea. Mouth/Throat: Mucous membranes are moist.  Oropharynx With area of induration and fluctuance at the proximal superior roof behind the front incisor on the left. About 1 cm in diameter. Neck:  Supple.  Left tonsillar adenopathy noted Cardiovascular: Normal rate, regular rhythm. Grossly normal heart sounds.  Good peripheral circulation. Respiratory: Normal respiratory effort.  No retractions. Lungs CTAB. Musculoskeletal: Nml ROM of upper and lower extremity joints. Neurologic:  Normal speech and language. No gross focal neurologic deficits are appreciated. No gait instability. Skin:  Skin is warm, dry and intact. No rash noted. Psychiatric: Mood and affect are normal. Speech and behavior are normal.  ____________________________________________   LABS (all labs ordered are listed, but only abnormal results are displayed)  Labs Reviewed - No data to display ____________________________________________  EKG   ____________________________________________  RADIOLOGY   ____________________________________________   PROCEDURES  Procedure(s) performed: Obtained consent. Dental block infraorbital region on the left with partial anesthesia to the roof of the mouth. Used an 11 blade to make a tiny incision into the abscess. Small amount of serosanguineous/purulent drainage. Salt water rinses in the room.  Patient tolerated well.  Critical Care performed: No  ____________________________________________   INITIAL IMPRESSION / ASSESSMENT AND PLAN / ED COURSE  Pertinent labs & imaging results that were available during my care of the patient were reviewed by me and considered in my medical decision making (see chart for details).  55 year old with abscess behind the front superior incisor. Attempted I&D per procedure above. Small amount of drainage. Started on clindamycin. Instructed to return to the emergency room tomorrow for worsening pain, swelling, fevers or chills. She also is able to contact a dentist for follow-up. ____________________________________________   FINAL CLINICAL IMPRESSION(S) / ED DIAGNOSES  Final diagnoses:  Dental abscess      Mortimer Fries, PA-C 07/31/15 2053  Nance Pear, MD 07/31/15 2107

## 2015-08-02 ENCOUNTER — Emergency Department
Admission: EM | Admit: 2015-08-02 | Discharge: 2015-08-02 | Disposition: A | Discharge status: Home / Self Care | Payer: Medicaid Other | Attending: Emergency Medicine | Admitting: Emergency Medicine

## 2015-08-02 ENCOUNTER — Encounter: Payer: Self-pay | Admitting: Emergency Medicine

## 2015-08-02 DIAGNOSIS — F329 Major depressive disorder, single episode, unspecified: Secondary | ICD-10-CM | POA: Insufficient documentation

## 2015-08-02 DIAGNOSIS — K0889 Other specified disorders of teeth and supporting structures: Secondary | ICD-10-CM | POA: Diagnosis present

## 2015-08-02 DIAGNOSIS — F1290 Cannabis use, unspecified, uncomplicated: Secondary | ICD-10-CM | POA: Insufficient documentation

## 2015-08-02 DIAGNOSIS — Z792 Long term (current) use of antibiotics: Secondary | ICD-10-CM | POA: Insufficient documentation

## 2015-08-02 DIAGNOSIS — Z79899 Other long term (current) drug therapy: Secondary | ICD-10-CM | POA: Insufficient documentation

## 2015-08-02 DIAGNOSIS — Z794 Long term (current) use of insulin: Secondary | ICD-10-CM | POA: Insufficient documentation

## 2015-08-02 DIAGNOSIS — F17210 Nicotine dependence, cigarettes, uncomplicated: Secondary | ICD-10-CM | POA: Insufficient documentation

## 2015-08-02 DIAGNOSIS — I10 Essential (primary) hypertension: Secondary | ICD-10-CM | POA: Insufficient documentation

## 2015-08-02 DIAGNOSIS — K047 Periapical abscess without sinus: Secondary | ICD-10-CM | POA: Diagnosis not present

## 2015-08-02 DIAGNOSIS — E119 Type 2 diabetes mellitus without complications: Secondary | ICD-10-CM | POA: Diagnosis not present

## 2015-08-02 MED ORDER — TRAMADOL HCL 50 MG PO TABS: 50.0000 mg | ORAL_TABLET | Freq: Four times a day (QID) | ORAL | Status: DC | PRN

## 2015-08-02 MED ORDER — ONDANSETRON 4 MG PO TBDP: 4.0000 mg | ORAL_TABLET | Freq: Three times a day (TID) | ORAL | Status: DC | PRN

## 2015-08-02 MED ORDER — ONDANSETRON 4 MG PO TBDP
4.0000 mg | ORAL_TABLET | Freq: Once | ORAL | Status: AC
  Administered 2015-08-02: 4 mg via ORAL
  Filled 2015-08-02: qty 1

## 2015-08-02 MED ORDER — TRAMADOL HCL 50 MG PO TABS
100.0000 mg | ORAL_TABLET | Freq: Once | ORAL | Status: AC
  Administered 2015-08-02: 100 mg via ORAL
  Filled 2015-08-02: qty 2

## 2015-08-02 NOTE — ED Notes (Signed)
Patient to ER with c/o ulcer in her mouth. Patient states that this has been going on since Monday. Patient has had the ulcer drained twice but says that it is not any better. Patient also states that her pain medication and antibiotics are making her nauseated.

## 2015-08-02 NOTE — ED Provider Notes (Signed)
CSN: [id number]     Arrival date & time 08/02/15  1248 History   First MD Initiated Contact with Patient 08/02/15 1255     Chief Complaint  Patient presents with  . Dental Pain     (Consider location/radiation/quality/duration/timing/severity/associated sxs/prior Treatment) HPI  55 year old female presents to the emergency department for evaluation of continued dental abscess. Patient states she's had pain and swelling for 4 days, was seen in the ER 2 days ago had incision and drainage performed and was placed on oxycodone as well as clindamycin. One of the medications is causing her to have severe nausea, she believes it is the oxycodone as she is unable tolerate anything with codeine patient states she is also unable tolerate anything the penicillin. She has not had any nausea medication. She denies any fevers. No difficulty swallowing. Pain is currently 9 out of 10. She is not taking any other medications for pain.  Past Medical History  Diagnosis Date  . Diabetes mellitus without complication ([place])   . Hypertension   . Depressed    Past Surgical History  Procedure Laterality Date  . Tubal ligation    . Vascular stent Right 2012    Lower leg  . Colonscopy  2012    polyps   Family History  Problem Relation Age of Onset  . Diabetes Mother   . Hypertension Mother   . Thyroid disease Daughter   . Throat cancer Mother    Social History  Substance Use Topics  . Smoking status: Current Every Day Smoker -- 1.00 packs/day    Types: Cigarettes  . Smokeless tobacco: None  . Alcohol Use: No   OB History    Gravida Para Term Preterm AB TAB SAB Ectopic Multiple Living   '3 1 1  2  2        '$ Review of Systems  Constitutional: Negative.  Negative for fever, chills, activity change and fatigue.  HENT: Positive for dental problem. Negative for congestion, drooling, facial swelling, mouth sores, sinus pressure, sore throat, trouble swallowing and voice change.   Eyes: Negative for  visual disturbance.  Respiratory: Negative for cough, chest tightness and shortness of breath.   Cardiovascular: Negative for chest pain and leg swelling.  Gastrointestinal: Negative for nausea, vomiting, abdominal pain and diarrhea.  Genitourinary: Negative for dysuria.  Musculoskeletal: Negative for arthralgias, gait problem, neck pain and neck stiffness.  Skin: Negative.  Negative for rash.  Neurological: Negative for weakness, numbness and headaches.  Hematological: Negative for adenopathy.  Psychiatric/Behavioral: Negative for behavioral problems, confusion and agitation.  All other systems reviewed and are negative.     Allergies  Penicillins  Home Medications   Prior to Admission medications   Medication Sig Start Date End Date Taking? Authorizing Provider  clindamycin (CLEOCIN) 300 MG capsule Take 1 capsule (300 mg total) by mouth 3 (three) times daily. 07/31/15   Mortimer Fries, PA-C  gabapentin (NEURONTIN) 300 MG capsule Take 300 mg by mouth 3 (three) times daily as needed (for leg pain).     Historical Provider, MD  glucose blood (ACCU-CHEK AVIVA) test strip Use as instructed 05/02/15   Glean Hess, MD  insulin aspart (NOVOLOG FLEXPEN) 100 UNIT/ML FlexPen Inject 10 Units into the skin 3 (three) times daily with meals. 08/30/14   Glean Hess, MD  Insulin Detemir (LEVEMIR FLEXTOUCH) 100 UNIT/ML Pen Inject 20 Units into the skin daily. 12 units in the AM and 8 units in PM 08/30/14   Glean Hess, MD  lisinopril (PRINIVIL,ZESTRIL) 10 MG tablet Take 10 mg by mouth daily.    Historical Provider, MD  LORazepam (ATIVAN) 0.5 MG tablet Take 1 tablet (0.5 mg total) by mouth every 8 (eight) hours as needed for anxiety. 04/04/15 04/03/16  Glean Hess, MD  mirtazapine (REMERON) 15 MG tablet Take 1 tablet (15 mg total) by mouth at bedtime. 12/19/14   Glean Hess, MD  ondansetron (ZOFRAN ODT) 4 MG disintegrating tablet Take 1 tablet (4 mg total) by mouth every 8 (eight) hours  as needed for nausea or vomiting. 08/02/15   Duanne Guess, PA-C  oxyCODONE-acetaminophen (ROXICET) 5-325 MG tablet Take 1 tablet by mouth every 6 (six) hours as needed. 07/31/15   Mortimer Fries, PA-C  sertraline (ZOLOFT) 50 MG tablet Take 50 mg by mouth daily.    Historical Provider, MD  traMADol (ULTRAM) 50 MG tablet Take 1-2 tablets (50-100 mg total) by mouth every 6 (six) hours as needed. 08/02/15   Duanne Guess, PA-C   BP 160/79 mmHg  Pulse 90  Temp(Src) 98.1 F (36.7 C) (Oral)  SpO2 97% Physical Exam  Constitutional: She is oriented to person, place, and time. She appears well-developed and well-nourished. No distress.  HENT:  Head: Normocephalic and atraumatic.  Mouth/Throat: Oropharynx is clear and moist. No trismus in the jaw. Dental abscesses and dental caries present. No uvula swelling. No oropharyngeal exudate, posterior oropharyngeal edema, posterior oropharyngeal erythema or tonsillar abscesses.    Eyes: EOM are normal. Pupils are equal, round, and reactive to light. Right eye exhibits no discharge. Left eye exhibits no discharge.  Neck: Normal range of motion. Neck supple.  Cardiovascular: Normal rate, regular rhythm and intact distal pulses.   Pulmonary/Chest: Effort normal and breath sounds normal. No respiratory distress. She exhibits no tenderness.  Abdominal: Soft. She exhibits no distension. There is no tenderness.  Musculoskeletal: Normal range of motion. She exhibits no edema.  Lymphadenopathy:    She has no cervical adenopathy.  Neurological: She is alert and oriented to person, place, and time. She has normal reflexes.  Skin: Skin is warm and dry. No erythema.  Psychiatric: She has a normal mood and affect. Her behavior is normal. Thought content normal.    ED Course  Procedures (including critical care time) Labs Review Labs Reviewed - No data to display  Imaging Review No results found. I have personally reviewed and evaluated these images and lab  results as part of my medical decision-making.   EKG Interpretation None      MDM   Final diagnoses:  Dental abscess    55 year old female with dental pain and abscess 4 days, seen 2 days ago had incision and drainage. She has continued drainage, this was expressed completely today. She is afebrile. Presented today due to continued pain and unable tolerate the oxycodone. Oxycodone discontinued, prescription for tramadol given. She has been able tolerate tramadol in the past. Recommended patient continue clindamycin, she is allergic to penicillin. She is given a prescription for Zofran. She will return to the ER for any fevers, worsening symptoms, urgent changes in her health. She will follow-up with dental clinic Monday morning.    Duanne Guess, PA-C 08/02/15 1400  Lisa Roca, MD 08/02/15 1500

## 2015-08-02 NOTE — Discharge Instructions (Signed)
Dental Abscess A dental abscess is a collection of pus in or around a tooth. CAUSES This condition is caused by a bacterial infection around the root of the tooth that involves the inner part of the tooth (pulp). It may result from:  Severe tooth decay.  Trauma to the tooth that allows bacteria to enter into the pulp, such as a broken or chipped tooth.  Severe gum disease around a tooth. SYMPTOMS Symptoms of this condition include:  Severe pain in and around the infected tooth.  Swelling and redness around the infected tooth, in the mouth, or in the face.  Tenderness.  Pus drainage.  Bad breath.  Bitter taste in the mouth.  Difficulty swallowing.  Difficulty opening the mouth.  Nausea.  Vomiting.  Chills.  Swollen neck glands.  Fever. DIAGNOSIS This condition is diagnosed with examination of the infected tooth. During the exam, your dentist may tap on the infected tooth. Your dentist will also ask about your medical and dental history and may order X-rays. TREATMENT This condition is treated by eliminating the infection. This may be done with:  Antibiotic medicine.  A root canal. This may be performed to save the tooth.  Pulling (extracting) the tooth. This may also involve draining the abscess. This is done if the tooth cannot be saved. HOME CARE INSTRUCTIONS  Take medicines only as directed by your dentist.  If you were prescribed antibiotic medicine, finish all of it even if you start to feel better.  Rinse your mouth (gargle) often with salt water to relieve pain or swelling.  Do not drive or operate heavy machinery while taking pain medicine.  Do not apply heat to the outside of your mouth.  Keep all follow-up visits as directed by your dentist. This is important. SEEK MEDICAL CARE IF:  Your pain is worse and is not helped by medicine. SEEK IMMEDIATE MEDICAL CARE IF:  You have a fever or chills.  Your symptoms suddenly get worse.  You have a  very bad headache.  You have problems breathing or swallowing.  You have trouble opening your mouth.  You have swelling in your neck or around your eye.   This information is not intended to replace advice given to you by your health care provider. Make sure you discuss any questions you have with your health care provider.   Document Released: 01/18/2005 Document Revised: 06/04/2014 Document Reviewed: 01/15/2014 Elsevier Interactive Patient Education 2016 [employer].  Please take antibiotics and pain medications as prescribed. Swish gargle and spit warm salt water 2-3 times daily. Follow-up with dental clinic in 2-3 days.

## 2015-08-12 ENCOUNTER — Encounter: Payer: Self-pay | Admitting: *Deleted

## 2015-08-12 ENCOUNTER — Emergency Department
Admission: EM | Admit: 2015-08-12 | Discharge: 2015-08-12 | Disposition: A | Discharge status: Home / Self Care | Payer: Medicaid Other | Attending: Emergency Medicine | Admitting: Emergency Medicine

## 2015-08-12 ENCOUNTER — Emergency Department: Payer: Medicaid Other

## 2015-08-12 DIAGNOSIS — Z794 Long term (current) use of insulin: Secondary | ICD-10-CM | POA: Diagnosis not present

## 2015-08-12 DIAGNOSIS — F1290 Cannabis use, unspecified, uncomplicated: Secondary | ICD-10-CM | POA: Diagnosis not present

## 2015-08-12 DIAGNOSIS — Z79899 Other long term (current) drug therapy: Secondary | ICD-10-CM | POA: Insufficient documentation

## 2015-08-12 DIAGNOSIS — Z79891 Long term (current) use of opiate analgesic: Secondary | ICD-10-CM | POA: Insufficient documentation

## 2015-08-12 DIAGNOSIS — I10 Essential (primary) hypertension: Secondary | ICD-10-CM | POA: Insufficient documentation

## 2015-08-12 DIAGNOSIS — R1032 Left lower quadrant pain: Secondary | ICD-10-CM | POA: Diagnosis present

## 2015-08-12 DIAGNOSIS — F331 Major depressive disorder, recurrent, moderate: Secondary | ICD-10-CM | POA: Diagnosis not present

## 2015-08-12 DIAGNOSIS — E11319 Type 2 diabetes mellitus with unspecified diabetic retinopathy without macular edema: Secondary | ICD-10-CM | POA: Insufficient documentation

## 2015-08-12 DIAGNOSIS — Z792 Long term (current) use of antibiotics: Secondary | ICD-10-CM | POA: Diagnosis not present

## 2015-08-12 DIAGNOSIS — E1140 Type 2 diabetes mellitus with diabetic neuropathy, unspecified: Secondary | ICD-10-CM | POA: Insufficient documentation

## 2015-08-12 DIAGNOSIS — I739 Peripheral vascular disease, unspecified: Secondary | ICD-10-CM | POA: Diagnosis not present

## 2015-08-12 DIAGNOSIS — E785 Hyperlipidemia, unspecified: Secondary | ICD-10-CM | POA: Diagnosis not present

## 2015-08-12 DIAGNOSIS — K5900 Constipation, unspecified: Secondary | ICD-10-CM

## 2015-08-12 DIAGNOSIS — F17210 Nicotine dependence, cigarettes, uncomplicated: Secondary | ICD-10-CM | POA: Insufficient documentation

## 2015-08-12 DIAGNOSIS — E1142 Type 2 diabetes mellitus with diabetic polyneuropathy: Secondary | ICD-10-CM | POA: Diagnosis not present

## 2015-08-12 LAB — CBC
HEMATOCRIT: 42.3 % (ref 35.0–47.0)
Hemoglobin: 14.5 g/dL (ref 12.0–16.0)
MCH: 30 pg (ref 26.0–34.0)
MCHC: 34.2 g/dL (ref 32.0–36.0)
MCV: 87.9 fL (ref 80.0–100.0)
Platelets: 469 10*3/uL — ABNORMAL HIGH (ref 150–440)
RBC: 4.81 MIL/uL (ref 3.80–5.20)
RDW: 13.8 % (ref 11.5–14.5)
WBC: 20.1 10*3/uL — ABNORMAL HIGH (ref 3.6–11.0)

## 2015-08-12 LAB — LIPASE, BLOOD: Lipase: 16 U/L (ref 11–51)

## 2015-08-12 LAB — COMPREHENSIVE METABOLIC PANEL
ALBUMIN: 3.6 g/dL (ref 3.5–5.0)
ALK PHOS: 80 U/L (ref 38–126)
ALT: 14 U/L (ref 14–54)
AST: 22 U/L (ref 15–41)
Anion gap: 12 (ref 5–15)
BILIRUBIN TOTAL: 0.6 mg/dL (ref 0.3–1.2)
BUN: 14 mg/dL (ref 6–20)
CALCIUM: 8.9 mg/dL (ref 8.9–10.3)
CO2: 20 mmol/L — ABNORMAL LOW (ref 22–32)
Chloride: 104 mmol/L (ref 101–111)
Creatinine, Ser: 0.58 mg/dL (ref 0.44–1.00)
GFR calc Af Amer: >60 mL/min (ref >60)
GFR calc non Af Amer: >60 mL/min (ref >60)
GLUCOSE: 328 mg/dL — AB (ref 65–99)
POTASSIUM: 3.6 mmol/L (ref 3.5–5.1)
Sodium: 136 mmol/L (ref 135–145)
TOTAL PROTEIN: 6.9 g/dL (ref 6.5–8.1)

## 2015-08-12 LAB — URINALYSIS COMPLETE WITH MICROSCOPIC (ARMC ONLY)
BACTERIA UA: NONE SEEN
BILIRUBIN URINE: NEGATIVE
Leukocytes, UA: NEGATIVE
NITRITE: NEGATIVE
Protein, ur: 100 mg/dL — AB
Specific Gravity, Urine: 1.01 (ref 1.005–1.030)
pH: 7 (ref 5.0–8.0)

## 2015-08-12 LAB — GLUCOSE, CAPILLARY
GLUCOSE-CAPILLARY: 201 mg/dL — AB (ref 65–99)
Glucose-Capillary: 324 mg/dL — ABNORMAL HIGH (ref 65–99)
Glucose-Capillary: 279 mg/dL — ABNORMAL HIGH (ref 65–99)

## 2015-08-12 MED ORDER — SODIUM CHLORIDE 0.9 % IV BOLUS (SEPSIS)
1000.0000 mL | Freq: Once | INTRAVENOUS | Status: AC
  Administered 2015-08-12: 1000 mL via INTRAVENOUS

## 2015-08-12 MED ORDER — INSULIN ASPART 100 UNIT/ML ~~LOC~~ SOLN
4.0000 [IU] | Freq: Once | SUBCUTANEOUS | Status: AC
  Administered 2015-08-12: 4 [IU] via SUBCUTANEOUS
  Filled 2015-08-12: qty 4

## 2015-08-12 MED ORDER — DIATRIZOATE MEGLUMINE & SODIUM 66-10 % PO SOLN
15.0000 mL | ORAL | Status: AC
  Administered 2015-08-12 (×2): 15 mL via ORAL

## 2015-08-12 MED ORDER — ONDANSETRON 4 MG PO TBDP
4.0000 mg | ORAL_TABLET | Freq: Once | ORAL | Status: AC
  Administered 2015-08-12: 4 mg via ORAL
  Filled 2015-08-12: qty 1

## 2015-08-12 MED ORDER — ONDANSETRON 4 MG PO TBDP: 4.0000 mg | ORAL_TABLET | Freq: Four times a day (QID) | ORAL | Status: DC | PRN

## 2015-08-12 MED ORDER — MORPHINE SULFATE (PF) 2 MG/ML IV SOLN
2.0000 mg | Freq: Once | INTRAVENOUS | Status: AC
  Administered 2015-08-12: 2 mg via INTRAVENOUS
  Filled 2015-08-12: qty 1

## 2015-08-12 MED ORDER — ONDANSETRON HCL 4 MG/2ML IJ SOLN
4.0000 mg | Freq: Once | INTRAMUSCULAR | Status: AC
  Administered 2015-08-12: 4 mg via INTRAVENOUS
  Filled 2015-08-12: qty 2

## 2015-08-12 MED ORDER — METOCLOPRAMIDE HCL 5 MG/ML IJ SOLN
10.0000 mg | Freq: Once | INTRAMUSCULAR | Status: AC
  Administered 2015-08-12: 10 mg via INTRAVENOUS
  Filled 2015-08-12: qty 2

## 2015-08-12 MED ORDER — IOPAMIDOL (ISOVUE-300) INJECTION 61%
75.0000 mL | Freq: Once | INTRAVENOUS | Status: AC | PRN
  Administered 2015-08-12: 75 mL via INTRAVENOUS

## 2015-08-12 NOTE — ED Notes (Signed)
Patient unable to sign electronically due to malfunction of signature pad.

## 2015-08-12 NOTE — ED Notes (Signed)
Per EMS report, patient c/o vomiting since this morning and denies having a BM for two weeks. Blood sugar obtained by EMS is 325. EMTs reports patient stated she hadn't taken her insulin this morning due to the vomiting and wanted EMS to administer the insulin which was declined. Patient arrived moaning, asking for ice chips. Patient recently had a tooth abscessed and is currently on Clindamycin. EMS gave the patient '4mg'$  Zofran in the ambulance.

## 2015-08-12 NOTE — ED Notes (Signed)
Patient ambulated with steady gait to room commode. Patient urinated in hat placed in toilet, but also had a small BM that was mixed with the urine. MD aware.

## 2015-08-12 NOTE — ED Notes (Signed)
One small BM noted.

## 2015-08-12 NOTE — ED Provider Notes (Signed)
[facility] Emergency Department Provider Note  ____________________________________________  Time seen: Approximately 12:54 PM  I have reviewed the triage vital signs and the nursing notes.   HISTORY  Chief Complaint Emesis    HPI Sabrina Wells is a 55 y.o. female presents for evaluation of abdominal pain and inability to defecate for about 2 weeks.  Patient reports that she has a history of diabetes, she's also been treated recently for an infected tooth which is getting much better, and she had been taking clindamycin. She's had gastroparesis in the past and reports that for about the last 2 weeks she's not been able to have a bowel movement. She is having moderate to severe left-sided crampy lower abdominal pain. She had been feeling nauseated throughout the week, and start developing a small amount of nonbloody vomiting today.  She reports she had similar symptoms about a year ago, and was evaluated in the hospital for it.  No trouble urinating. No pain or burning with urination. No chest pain or trouble breathing.  She does have diabetes, she reports that she would normally take 5 units of NovoLog for a blood sugar over 300 on a sliding scale. She has been using her Levemir daily but did not take her NovoLog today.  Past Medical History  Diagnosis Date  . Diabetes mellitus without complication ([place])   . Hypertension   . Depressed     Patient Active Problem List   Diagnosis Date Noted  . PVD (peripheral vascular disease) ([place]) 12/19/2014  . Uncontrolled type 2 diabetes mellitus with retinopathy, with long-term current use of insulin ([place]) 12/19/2014  . Diabetic polyneuropathy associated with type 2 diabetes mellitus ([place]) 12/19/2014  . Gastroparesis 07/20/2014  . Multiple pulmonary nodules determined by computed tomography of lung 07/20/2014  . Splenic infarct 07/17/2014  . Intractable abdominal pain 07/17/2014  . Essential hypertension  07/17/2014  . Hyperlipidemia 07/17/2014  . DM type 2, uncontrolled, with neuropathy ([place]) 07/17/2014  . GAD (generalized anxiety disorder) 06/08/2014  . MDD (major depressive disorder), recurrent episode, moderate ([place])     Past Surgical History  Procedure Laterality Date  . Tubal ligation    . Vascular stent Right 2012    Lower leg  . Colonscopy  2012    polyps    Current Outpatient Rx  Name  Route  Sig  Dispense  Refill  . clindamycin (CLEOCIN) 300 MG capsule   Oral   Take 1 capsule (300 mg total) by mouth 3 (three) times daily.   30 capsule   0   . gabapentin (NEURONTIN) 300 MG capsule   Oral   Take 300 mg by mouth 3 (three) times daily as needed (for leg pain).          . insulin aspart (NOVOLOG FLEXPEN) 100 UNIT/ML FlexPen   Subcutaneous   Inject 10 Units into the skin 3 (three) times daily with meals.   15 mL   11   . Insulin Detemir (LEVEMIR FLEXTOUCH) 100 UNIT/ML Pen   Subcutaneous   Inject 20 Units into the skin daily. 12 units in the AM and 8 units in PM   15 mL   11   . lisinopril (PRINIVIL,ZESTRIL) 10 MG tablet   Oral   Take 10 mg by mouth daily.         Marland Kitchen LORazepam (ATIVAN) 0.5 MG tablet   Oral   Take 1 tablet (0.5 mg total) by mouth every 8 (eight) hours as needed for anxiety.  20 tablet   0   . mirtazapine (REMERON) 15 MG tablet   Oral   Take 1 tablet (15 mg total) by mouth at bedtime.   30 tablet   3   . oxyCODONE-acetaminophen (ROXICET) 5-325 MG tablet   Oral   Take 1 tablet by mouth every 6 (six) hours as needed.   10 tablet   0   . sertraline (ZOLOFT) 50 MG tablet   Oral   Take 50 mg by mouth daily.         . traMADol (ULTRAM) 50 MG tablet   Oral   Take 1-2 tablets (50-100 mg total) by mouth every 6 (six) hours as needed.   30 tablet   0   . glucose blood (ACCU-CHEK AVIVA) test strip      Use as instructed   100 each   12   . ondansetron (ZOFRAN ODT) 4 MG disintegrating tablet   Oral   Take 1 tablet (4 mg total)  by mouth every 6 (six) hours as needed for nausea or vomiting.   20 tablet   0     Allergies Penicillins  Family History  Problem Relation Age of Onset  . Diabetes Mother   . Hypertension Mother   . Thyroid disease Daughter   . Throat cancer Mother     Social History Social History  Substance Use Topics  . Smoking status: Current Every Day Smoker -- 1.00 packs/day    Types: Cigarettes  . Smokeless tobacco: None  . Alcohol Use: No    Review of Systems Constitutional: No fever/chills Eyes: No visual changes. ENT: No sore throat. Cardiovascular: Denies chest pain. Respiratory: Denies shortness of breath. Gastrointestinal: Feels constipated. See history of present illness. No diarrhea. No black stools. Genitourinary: Negative for dysuria. Musculoskeletal: Negative for back pain. Skin: Negative for rash. Neurological: Negative for headaches, focal weakness or numbness.  10-point ROS otherwise negative.  ____________________________________________   PHYSICAL EXAM:  VITAL SIGNS: ED Triage Vitals  Enc Vitals Group     BP 08/12/15 1210 141/61 mmHg     Pulse Rate 08/12/15 1210 63     Resp 08/12/15 1210 20     Temp 08/12/15 1210 98.1 F (36.7 C)     Temp Source 08/12/15 1210 Axillary     SpO2 08/12/15 1210 100 %     Weight 08/12/15 1210 110 lb (49.896 kg)     Height 08/12/15 1210 '5\' 3"'$  (1.6 m)     Head Cir --      Peak Flow --      Pain Score 08/12/15 1214 10     Pain Loc --      Pain Edu? --      Excl. in [place]? --    Constitutional: Alert and oriented. Appears in moderate discomfort, wincing. Eyes: Conjunctivae are normal. PERRL. EOMI. Head: Atraumatic. Nose: No congestion/rhinnorhea. Mouth/Throat: Mucous membranes are moist.  Oropharynx non-erythematous. Neck: No stridor.   Cardiovascular: Normal rate, regular rhythm. Grossly normal heart sounds.  Good peripheral circulation. Respiratory: Normal respiratory effort.  No retractions. Lungs  CTAB. Gastrointestinal: Soft and nontender except for moderate discomfort in the left lower quadrant without any evidence of distention, rebound, or guarding. Normal bowel sounds No CVA tenderness. Musculoskeletal: No lower extremity tenderness nor edema.  No joint effusions. Neurologic:  Normal speech and language. No gross focal neurologic deficits are appreciated. Skin:  Skin is warm, dry and intact. No rash noted. Psychiatric: Mood and affect are normal. Speech and  behavior are normal.  ____________________________________________   LABS (all labs ordered are listed, but only abnormal results are displayed)  Labs Reviewed  COMPREHENSIVE METABOLIC PANEL - Abnormal; Notable for the following:    CO2 20 (*)    Glucose, Bld 328 (*)    All other components within normal limits  CBC - Abnormal; Notable for the following:    WBC 20.1 (*)    Platelets 469 (*)    All other components within normal limits  URINALYSIS COMPLETEWITH MICROSCOPIC ([facility] ONLY) - Abnormal; Notable for the following:    Color, Urine STRAW (*)    APPearance CLEAR (*)    Glucose, UA >500 (*)    Ketones, ur 1+ (*)    Hgb urine dipstick 1+ (*)    Protein, ur 100 (*)    Squamous Epithelial / LPF 0-5 (*)    All other components within normal limits  GLUCOSE, CAPILLARY - Abnormal; Notable for the following:    Glucose-Capillary 324 (*)    All other components within normal limits  GLUCOSE, CAPILLARY - Abnormal; Notable for the following:    Glucose-Capillary 279 (*)    All other components within normal limits  GLUCOSE, CAPILLARY - Abnormal; Notable for the following:    Glucose-Capillary 201 (*)    All other components within normal limits  LIPASE, BLOOD  CBG MONITORING, ED   ____________________________________________  EKG  ED ECG REPORT I, Aleanna Menge, the attending physician, personally viewed and interpreted this ECG.  Date: 08/12/2015 EKG Time: 12:15 Rate: 60 Rhythm: normal sinus rhythm, though  biatrial enlargement is noted QRS Axis: normal Intervals: normal ST/T Wave abnormalities: normal Conduction Disturbances: none Narrative Interpretation: unremarkable aside from biatrial enlargement, no evidence of acute ischemic and her malady  ____________________________________________  RADIOLOGY  CT Abdomen Pelvis W Contrast (Final result) Result time: 08/12/15 15:29:42   Final result by Rad Results In Interface (08/12/15 15:29:42)   Narrative:   CLINICAL DATA: Vomiting since this morning.  EXAM: CT ABDOMEN AND PELVIS WITH CONTRAST  TECHNIQUE: Multidetector CT imaging of the abdomen and pelvis was performed using the standard protocol following bolus administration of intravenous contrast.  CONTRAST: 56m ISOVUE-300 IOPAMIDOL (ISOVUE-300) INJECTION 61%  COMPARISON: July 17, 2014  FINDINGS: Lower chest: No acute findings.  Hepatobiliary: There is a 1 cm low-density in the posterior segment right lobe liver probably a cyst unchanged compared to prior CT. The liver is otherwise unremarkable. The gallbladder is normal. The biliary tree is normal.  Pancreas: No mass, inflammatory changes, or other significant abnormality.  Spleen: Within normal limits in size and appearance.  Adrenals/Urinary Tract: The adrenal glands are normal. There are bilateral parapelvic renal cysts. The kidneys are otherwise normal. No masses identified. No evidence of hydronephrosis. The bladder is normal.  Stomach/Bowel: No evidence of obstruction, inflammatory process, or abnormal fluid collections. Extensive bowel content is identified throughout colon. There is a small hiatal hernia.  Vascular/Lymphatic: No pathologically enlarged lymph nodes. No evidence of abdominal aortic aneurysm. There is atherosclerosis of the abdominal aorta.  Reproductive: No mass or other significant abnormality.  Other: None.  Musculoskeletal: No suspicious bone lesions  identified.  IMPRESSION: No acute abnormality identified in the abdomen and pelvis.  Extensive bowel content identified throughout colon which can be seen in constipation.  No bowel obstruction.  Probable liver cyst unchanged.   Electronically Signed By: WAbelardo DieselM.D. On: 08/12/2015 15:29       ____________________________________________   PROCEDURES  Procedure(s) performed: None  Critical Care performed:  No  ____________________________________________   INITIAL IMPRESSION / ASSESSMENT AND PLAN / ED COURSE  Pertinent labs & imaging results that were available during my care of the patient were reviewed by me and considered in my medical decision making (see chart for details).  Differential diagnosis includes but is not limited to, abdominal perforation, aortic dissection, cholecystitis, appendicitis, diverticulitis, colitis, esophagitis/gastritis, kidney stone, pyelonephritis, urinary tract infection, aortic aneurysm. All are considered in decision and treatment plan. Based upon the patient's presentation and risk factors, I will proceed with CT abdomen and pelvis. The patient did have a previous splenic infarct noted on a CT. However today her symptoms do seem more consistent with a constipation-like picture as she reports no bowel movement in several days.  ----------------------------------------- 6:36 PM on 08/12/2015 -----------------------------------------  Patient reports she feels much better. She's had 2 bowel movements now after drinking contrast for CT, and reports her pain and symptoms are much better. She has some mild nausea. She notable leukocytosis on her lab work, however this could be due to pain, inflammatory process and there is no evidence of acute infection on CT and she does not demonstrate any fever or systemic signs such as chills, fatigue, or evidence of acute infection by clinical history or exam.  As the patient feels much better,  I will discharge her with a prescription for Zofran and careful follow-up and return precautions which she is agreeable to.  ____________________________________________   FINAL CLINICAL IMPRESSION(S) / ED DIAGNOSES  Final diagnoses:  Constipation, unspecified constipation type      Delman Kitten, MD 08/12/15 1836

## 2015-08-12 NOTE — ED Notes (Signed)
Patient had a large emesis of clear liquid after drinking oral contrast.

## 2015-08-12 NOTE — ED Notes (Signed)
Assisted pt to bathroom. Pt had large bowel movement.

## 2015-08-12 NOTE — Discharge Instructions (Signed)
You were seen in the emergency room for abdominal pain. It is important that you follow up closely with your primary care doctor in the next couple of days.  If you're unable to see her primary care doctor you may return to the emergency room or go to the [place] walk-in clinic in 1 or 2 days for reexam.  Please return to the emergency room right away if you are to develop a fever, severe nausea, your pain becomes severe or worsens, you are unable to keep food down, begin vomiting any dark or bloody fluid, you develop any dark or bloody stools, feel dehydrated, or other new concerns or symptoms arise.   Constipation, Adult Constipation is when a person has fewer than three bowel movements a week, has difficulty having a bowel movement, or has stools that are dry, hard, or larger than normal. As people grow older, constipation is more common. A low-fiber diet, not taking in enough fluids, and taking certain medicines may make constipation worse.  CAUSES   Certain medicines, such as antidepressants, pain medicine, iron supplements, antacids, and water pills.   Certain diseases, such as diabetes, irritable bowel syndrome (IBS), thyroid disease, or depression.   Not drinking enough water.   Not eating enough fiber-rich foods.   Stress or travel.   Lack of physical activity or exercise.   Ignoring the urge to have a bowel movement.   Using laxatives too much.  SIGNS AND SYMPTOMS   Having fewer than three bowel movements a week.   Straining to have a bowel movement.   Having stools that are hard, dry, or larger than normal.   Feeling full or bloated.   Pain in the lower abdomen.   Not feeling relief after having a bowel movement.  DIAGNOSIS  Your health care provider will take a medical history and perform a physical exam. Further testing may be done for severe constipation. Some tests may include:  A barium enema X-ray to examine your rectum, colon, and, sometimes,  your small intestine.   A sigmoidoscopy to examine your lower colon.   A colonoscopy to examine your entire colon. TREATMENT  Treatment will depend on the severity of your constipation and what is causing it. Some dietary treatments include drinking more fluids and eating more fiber-rich foods. Lifestyle treatments may include regular exercise. If these diet and lifestyle recommendations do not help, your health care provider may recommend taking over-the-counter laxative medicines to help you have bowel movements. Prescription medicines may be prescribed if over-the-counter medicines do not work.  HOME CARE INSTRUCTIONS   Eat foods that have a lot of fiber, such as fruits, vegetables, whole grains, and beans.  Limit foods high in fat and processed sugars, such as french fries, hamburgers, cookies, candies, and soda.   A fiber supplement may be added to your diet if you cannot get enough fiber from foods.   Drink enough fluids to keep your urine clear or pale yellow.   Exercise regularly or as directed by your health care provider.   Go to the restroom when you have the urge to go. Do not hold it.   Only take over-the-counter or prescription medicines as directed by your health care provider. Do not take other medicines for constipation without talking to your health care provider first.  Lookout IF:   You have bright red blood in your stool.   Your constipation lasts for more than 4 days or gets worse.   You have  abdominal or rectal pain.   You have thin, pencil-like stools.   You have unexplained weight loss. MAKE SURE YOU:   Understand these instructions.  Will watch your condition.  Will get help right away if you are not doing well or get worse.   This information is not intended to replace advice given to you by your health care provider. Make sure you discuss any questions you have with your health care provider.   Document Released:  10/17/2003 Document Revised: 02/08/2014 Document Reviewed: 10/30/2012 Elsevier Interactive Patient Education [employer].

## 2015-08-12 NOTE — ED Notes (Signed)
Patient had a large BM. Dr. Jacqualine Code aware.

## 2015-09-15 ENCOUNTER — Other Ambulatory Visit: Payer: Self-pay | Admitting: Internal Medicine

## 2015-09-15 ENCOUNTER — Telehealth: Payer: Self-pay

## 2015-09-15 MED ORDER — INSULIN DETEMIR 100 UNIT/ML FLEXPEN: 20.0000 [IU] | PEN_INJECTOR | Freq: Every day | SUBCUTANEOUS | 11 refills | Status: DC

## 2015-09-15 NOTE — Telephone Encounter (Signed)
Patient stated her pharmacy faxed over a refill request two weeks ago and they still haven't received a fax back yet about refills. Patient is completely out of insulin. [facility].

## 2015-11-05 ENCOUNTER — Other Ambulatory Visit: Payer: Self-pay | Admitting: Internal Medicine

## 2015-11-05 ENCOUNTER — Telehealth: Payer: Self-pay

## 2015-11-05 MED ORDER — GABAPENTIN 300 MG PO CAPS: 300.0000 mg | ORAL_CAPSULE | Freq: Three times a day (TID) | ORAL | 0 refills | Status: DC

## 2015-11-05 NOTE — Telephone Encounter (Signed)
I sent a one month supply - I have never prescribed this before.  She needs to schedule DM follow up for more refills.

## 2015-11-05 NOTE — Telephone Encounter (Signed)
Patient needs refill gabapentin

## 2015-11-10 ENCOUNTER — Telehealth: Payer: Self-pay

## 2015-11-10 NOTE — Telephone Encounter (Signed)
Left message that she needs new order for Bil Diag Mammo since [place] said the order is good till Nov but it is ordered for right breast when patient has lumps in L breast. I see all of those orders in system so please review and let me know what to do next. Patient also said before hanging up that we were Dumb asses. So I need to know what to do next.

## 2015-11-10 NOTE — Telephone Encounter (Signed)
The mammogram orders were placed last November when she had a mass on the left breast. She apparently never went for that study.  If she now has a mass on the right breast, she will need to be seen for new orders to be placed.

## 2015-11-12 ENCOUNTER — Encounter: Payer: Self-pay | Admitting: Internal Medicine

## 2015-11-12 ENCOUNTER — Encounter (INDEPENDENT_AMBULATORY_CARE_PROVIDER_SITE_OTHER): Payer: Self-pay

## 2015-11-12 ENCOUNTER — Ambulatory Visit (INDEPENDENT_AMBULATORY_CARE_PROVIDER_SITE_OTHER): Payer: Medicaid Other | Admitting: Internal Medicine

## 2015-11-12 VITALS — BP 148/88 | HR 80 | Resp 16 | Ht 63.0 in | Wt 107.8 lb

## 2015-11-12 DIAGNOSIS — F331 Major depressive disorder, recurrent, moderate: Secondary | ICD-10-CM

## 2015-11-12 DIAGNOSIS — Z794 Long term (current) use of insulin: Secondary | ICD-10-CM | POA: Diagnosis not present

## 2015-11-12 DIAGNOSIS — E1169 Type 2 diabetes mellitus with other specified complication: Secondary | ICD-10-CM

## 2015-11-12 DIAGNOSIS — N63 Unspecified lump in unspecified breast: Secondary | ICD-10-CM

## 2015-11-12 DIAGNOSIS — E11311 Type 2 diabetes mellitus with unspecified diabetic retinopathy with macular edema: Secondary | ICD-10-CM

## 2015-11-12 DIAGNOSIS — I10 Essential (primary) hypertension: Secondary | ICD-10-CM

## 2015-11-12 DIAGNOSIS — N6322 Unspecified lump in the left breast, upper inner quadrant: Secondary | ICD-10-CM

## 2015-11-12 DIAGNOSIS — M546 Pain in thoracic spine: Secondary | ICD-10-CM

## 2015-11-12 DIAGNOSIS — F411 Generalized anxiety disorder: Secondary | ICD-10-CM | POA: Diagnosis not present

## 2015-11-12 DIAGNOSIS — F4310 Post-traumatic stress disorder, unspecified: Secondary | ICD-10-CM

## 2015-11-12 DIAGNOSIS — E1165 Type 2 diabetes mellitus with hyperglycemia: Secondary | ICD-10-CM | POA: Diagnosis not present

## 2015-11-12 DIAGNOSIS — N6321 Unspecified lump in the left breast, upper outer quadrant: Secondary | ICD-10-CM | POA: Diagnosis not present

## 2015-11-12 DIAGNOSIS — N6311 Unspecified lump in the right breast, upper outer quadrant: Secondary | ICD-10-CM | POA: Diagnosis not present

## 2015-11-12 MED ORDER — LORAZEPAM 0.5 MG PO TABS: 0.5000 mg | ORAL_TABLET | Freq: Three times a day (TID) | ORAL | 0 refills | Status: DC | PRN

## 2015-11-12 MED ORDER — GABAPENTIN 300 MG PO CAPS: 300.0000 mg | ORAL_CAPSULE | Freq: Three times a day (TID) | ORAL | 5 refills | Status: DC

## 2015-11-12 MED ORDER — LISINOPRIL 10 MG PO TABS: 10.0000 mg | ORAL_TABLET | Freq: Every day | ORAL | 1 refills | Status: DC

## 2015-11-12 NOTE — Progress Notes (Signed)
Date:  11/12/2015   Name:  Sabrina Wells   DOB:  22-Aug-1960   MRN:  [id number]   Chief Complaint: Breast Mass (right side this time-needs order for Diagnostic Mammo changed ); Hypertension (needs refill lisinopril); Back Pain (wants oxycodone ot Tramadol and is not seeing any other specialist due to transportation. ); and Anxiety (lost sister this morning wants refill on Lorazapam has not taken since last OV. )  Breast Lump - noticed on right about one week ago.  Non tender.  No nipple discharge.  She had lumps on the left one year ago but never went for mammogram. Last mammogram years ago in [place].  Hypertension  This is a chronic problem. The current episode started more than 1 year ago. The problem is uncontrolled. Associated symptoms include anxiety. Pertinent negatives include no chest pain, headaches, palpitations or shortness of breath. Past treatments include ACE inhibitors. Compliance problems: has not has lisinopril in months.   Back Pain  This is a new problem. The current episode started yesterday. The problem occurs intermittently. The pain is present in the thoracic spine. The pain does not radiate. Pertinent negatives include no abdominal pain, chest pain, fever or headaches. She has tried nothing for the symptoms.  Anxiety  Presents for follow-up visit. Symptoms include excessive worry and nervous/anxious behavior. Patient reports no chest pain, dizziness, palpitations, shortness of breath or suicidal ideas. Symptoms occur occasionally (several deaths in family and friends recently). The quality of sleep is fair.   Compliance with medications: did not like Zoloft; never follow up with [facility].  Diabetes  She presents for her follow-up diabetic visit. She has type 2 diabetes mellitus. Her disease course has been fluctuating. Hypoglycemia symptoms include nervousness/anxiousness. Pertinent negatives for hypoglycemia include no dizziness or headaches. Pertinent negatives for diabetes  include no chest pain. Current diabetic treatment includes intensive insulin program and oral agent (triple therapy). She is compliant with treatment most of the time. Her weight is stable. Her breakfast blood glucose is taken between 7-8 am. Her breakfast blood glucose range is generally 130-140 mg/dl. An ACE inhibitor/angiotensin II receptor blocker is not being taken.   Lab Results  Component Value Date   HGBA1C 9.0 (H) 04/04/2015    Review of Systems  Constitutional: Negative for diaphoresis, fever and unexpected weight change.  Respiratory: Positive for chest tightness. Negative for cough, shortness of breath and wheezing.   Cardiovascular: Negative for chest pain, palpitations and leg swelling.  Gastrointestinal: Negative for abdominal pain, blood in stool and constipation.  Musculoskeletal: Positive for arthralgias (knees and feet) and back pain. Negative for joint swelling.  Skin: Negative for color change and rash.  Neurological: Negative for dizziness and headaches.  Hematological: Negative for adenopathy.  Psychiatric/Behavioral: Positive for dysphoric mood. Negative for suicidal ideas. The patient is nervous/anxious.     Patient Active Problem List   Diagnosis Date Noted  . PVD (peripheral vascular disease) ([place]) 12/19/2014  . Uncontrolled type 2 diabetes mellitus with retinopathy, with long-term current use of insulin ([place]) 12/19/2014  . Diabetic polyneuropathy associated with type 2 diabetes mellitus ([place]) 12/19/2014  . Gastroparesis 07/20/2014  . Multiple pulmonary nodules determined by computed tomography of lung 07/20/2014  . Splenic infarct 07/17/2014  . Intractable abdominal pain 07/17/2014  . Essential hypertension 07/17/2014  . Hyperlipidemia 07/17/2014  . DM type 2, uncontrolled, with neuropathy ([place]) 07/17/2014  . GAD (generalized anxiety disorder) 06/08/2014  . MDD (major depressive disorder), recurrent episode, moderate ([place])  Prior to Admission  medications   Medication Sig Start Date End Date Taking? Authorizing Provider  gabapentin (NEURONTIN) 300 MG capsule Take 1 capsule (300 mg total) by mouth 3 (three) times daily. 11/05/15  Yes Glean Hess, MD  glucose blood (ACCU-CHEK AVIVA) test strip Use as instructed 05/02/15  Yes Glean Hess, MD  insulin aspart (NOVOLOG FLEXPEN) 100 UNIT/ML FlexPen Inject 10 Units into the skin 3 (three) times daily with meals. 08/30/14  Yes Glean Hess, MD  Insulin Detemir (LEVEMIR FLEXTOUCH) 100 UNIT/ML Pen Inject 20 Units into the skin daily. 12 units in the AM and 8 units in PM 09/15/15  Yes Glean Hess, MD  sertraline (ZOLOFT) 50 MG tablet Take 50 mg by mouth daily.   Yes Historical Provider, MD  lisinopril (PRINIVIL,ZESTRIL) 10 MG tablet Take 10 mg by mouth daily.    Historical Provider, MD  LORazepam (ATIVAN) 0.5 MG tablet Take 1 tablet (0.5 mg total) by mouth every 8 (eight) hours as needed for anxiety. Patient not taking: Reported on 11/12/2015 04/04/15 04/03/16  Glean Hess, MD  oxyCODONE-acetaminophen (ROXICET) 5-325 MG tablet Take 1 tablet by mouth every 6 (six) hours as needed. Patient not taking: Reported on 11/12/2015 07/31/15   Mortimer Fries, PA-C  traMADol (ULTRAM) 50 MG tablet Take 1-2 tablets (50-100 mg total) by mouth every 6 (six) hours as needed. Patient not taking: Reported on 11/12/2015 08/02/15   Duanne Guess, PA-C    Allergies  Allergen Reactions  . Penicillins Hives    Past Surgical History:  Procedure Laterality Date  . colonscopy  2012   polyps  . TUBAL LIGATION    . vascular stent Right 2012   Lower leg    Social History  Substance Use Topics  . Smoking status: Current Every Day Smoker    Packs/day: 1.00    Types: Cigarettes  . Smokeless tobacco: Never Used  . Alcohol use No     Medication list has been reviewed and updated.   Physical Exam  Constitutional: Vital signs are normal. She appears well-developed. She appears cachectic.  Neck:  Carotid bruit is not present.  Cardiovascular: Normal rate, regular rhythm and normal heart sounds.   No murmur heard. Pulmonary/Chest: She has decreased breath sounds. She has no wheezes. She has no rhonchi. Right breast exhibits mass. Right breast exhibits no inverted nipple, no nipple discharge, no skin change and no tenderness. Left breast exhibits mass. Left breast exhibits no inverted nipple, no nipple discharge, no skin change and no tenderness.    Abdominal: Soft. Normal appearance. There is no tenderness.  Musculoskeletal:       Right knee: She exhibits normal range of motion, no effusion and no deformity. Tenderness found.       Left knee: She exhibits normal range of motion, no swelling, no effusion and no deformity. Tenderness found.       Thoracic back: She exhibits tenderness (muscular tenderness; no spasm or bony tenderness).  Skin: Skin is warm and dry.  Psychiatric: She has a normal mood and affect.    BP (!) 148/88   Pulse 80   Resp 16   Ht '5\' 3"'$  (1.6 m)   Wt 107 lb 12.8 oz (48.9 kg)   SpO2 96%   BMI 19.10 kg/m   Assessment and Plan: 1. 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  Dispense: 90 capsule; Refill: 5 - Hemoglobin A1c - Comprehensive metabolic panel  2. Essential hypertension Resume medication for BP and renal protection - lisinopril (PRINIVIL,ZESTRIL) 10 MG tablet; Take 1 tablet (10 mg total) by mouth daily.  Dispense: 90 tablet; Refill: 1  3. MDD (major depressive disorder), recurrent episode, moderate ([place]) Patient prefers to remain off of medications such as Zoloft at this time Unable to go to [place] due to transportation issues  4. GAD (generalized anxiety disorder) As in #3 above  5.  Post-traumatic stress Will give a few sedatives for PRN use - LORazepam (ATIVAN) 0.5 MG tablet; Take 1 tablet (0.5 mg total) by mouth every 8 (eight) hours as needed for anxiety.  Dispense: 20 tablet; Refill: 0  6. Breast mass - MM Digital Diagnostic Bilat; Future - US BREAST LTD UNI RIGHT INC AXILLA; Future - US BREAST LTD UNI LEFT INC AXILLA; Future  7. Acute midline thoracic back pain Recommend Aleve and heat Request for tramadol or percocet declined  Halina Maidens, MD [facility]  11/12/2015

## 2015-11-12 NOTE — Patient Instructions (Signed)
Take Aleve twice a day and use heat to mid and upper back  Resume Lisinopril daily

## 2015-11-13 LAB — COMPREHENSIVE METABOLIC PANEL
A/G RATIO: 1.4 (ref 1.2–2.2)
ALT: 14 IU/L (ref 0–32)
AST: 19 IU/L (ref 0–40)
Albumin: 3.6 g/dL (ref 3.5–5.5)
Alkaline Phosphatase: 85 IU/L (ref 39–117)
BUN/Creatinine Ratio: 15 (ref 9–23)
BUN: 10 mg/dL (ref 6–24)
CHLORIDE: 99 mmol/L (ref 96–106)
CO2: 29 mmol/L (ref 18–29)
Calcium: 8.9 mg/dL (ref 8.7–10.2)
Creatinine, Ser: 0.67 mg/dL (ref 0.57–1.00)
GFR, EST AFRICAN AMERICAN: 113 mL/min/{1.73_m2} (ref >59)
GFR, EST NON AFRICAN AMERICAN: 98 mL/min/{1.73_m2} (ref >59)
GLOBULIN, TOTAL: 2.6 g/dL (ref 1.5–4.5)
Glucose: 303 mg/dL — ABNORMAL HIGH (ref 65–99)
POTASSIUM: 5.1 mmol/L (ref 3.5–5.2)
SODIUM: 139 mmol/L (ref 134–144)
TOTAL PROTEIN: 6.2 g/dL (ref 6.0–8.5)

## 2015-11-13 LAB — HEMOGLOBIN A1C
ESTIMATED AVERAGE GLUCOSE: 232 mg/dL
HEMOGLOBIN A1C: 9.7 % — AB (ref 4.8–5.6)

## 2015-11-26 ENCOUNTER — Ambulatory Visit
Admission: RE | Admit: 2015-11-26 | Discharge: 2015-11-26 | Disposition: A | Discharge status: Home / Self Care | Payer: Medicaid Other | Source: Ambulatory Visit | Attending: Internal Medicine | Admitting: Internal Medicine

## 2015-11-26 ENCOUNTER — Other Ambulatory Visit: Payer: Self-pay | Admitting: Internal Medicine

## 2015-11-26 DIAGNOSIS — N63 Unspecified lump in unspecified breast: Secondary | ICD-10-CM

## 2015-11-26 DIAGNOSIS — N6310 Unspecified lump in the right breast, unspecified quadrant: Secondary | ICD-10-CM | POA: Insufficient documentation

## 2015-11-26 DIAGNOSIS — E109 Type 1 diabetes mellitus without complications: Secondary | ICD-10-CM | POA: Diagnosis not present

## 2015-11-26 HISTORY — DX: Unspecified lump in unspecified breast: N63.0

## 2015-11-27 ENCOUNTER — Other Ambulatory Visit: Payer: Self-pay | Admitting: Internal Medicine

## 2015-11-27 DIAGNOSIS — N6310 Unspecified lump in the right breast, unspecified quadrant: Secondary | ICD-10-CM

## 2015-12-16 ENCOUNTER — Ambulatory Visit
Admission: RE | Admit: 2015-12-16 | Discharge: 2015-12-16 | Disposition: A | Discharge status: Home / Self Care | Payer: Medicaid Other | Source: Ambulatory Visit | Attending: Internal Medicine | Admitting: Internal Medicine

## 2015-12-16 DIAGNOSIS — Z9889 Other specified postprocedural states: Secondary | ICD-10-CM | POA: Diagnosis present

## 2015-12-16 DIAGNOSIS — N6310 Unspecified lump in the right breast, unspecified quadrant: Secondary | ICD-10-CM | POA: Diagnosis present

## 2015-12-17 LAB — SURGICAL PATHOLOGY

## 2016-01-29 ENCOUNTER — Emergency Department: Payer: Medicaid Other

## 2016-01-29 ENCOUNTER — Encounter: Payer: Self-pay | Admitting: Emergency Medicine

## 2016-01-29 ENCOUNTER — Emergency Department
Admission: EM | Admit: 2016-01-29 | Discharge: 2016-01-29 | Disposition: A | Discharge status: Home / Self Care | Payer: Medicaid Other | Attending: Emergency Medicine | Admitting: Emergency Medicine

## 2016-01-29 DIAGNOSIS — W1839XA Other fall on same level, initial encounter: Secondary | ICD-10-CM | POA: Diagnosis not present

## 2016-01-29 DIAGNOSIS — Z794 Long term (current) use of insulin: Secondary | ICD-10-CM | POA: Diagnosis not present

## 2016-01-29 DIAGNOSIS — E119 Type 2 diabetes mellitus without complications: Secondary | ICD-10-CM | POA: Diagnosis not present

## 2016-01-29 DIAGNOSIS — M25571 Pain in right ankle and joints of right foot: Secondary | ICD-10-CM | POA: Diagnosis not present

## 2016-01-29 DIAGNOSIS — Y929 Unspecified place or not applicable: Secondary | ICD-10-CM | POA: Diagnosis not present

## 2016-01-29 DIAGNOSIS — I10 Essential (primary) hypertension: Secondary | ICD-10-CM | POA: Insufficient documentation

## 2016-01-29 DIAGNOSIS — Y9389 Activity, other specified: Secondary | ICD-10-CM | POA: Insufficient documentation

## 2016-01-29 DIAGNOSIS — Y999 Unspecified external cause status: Secondary | ICD-10-CM | POA: Diagnosis not present

## 2016-01-29 DIAGNOSIS — S99911A Unspecified injury of right ankle, initial encounter: Secondary | ICD-10-CM | POA: Diagnosis present

## 2016-01-29 DIAGNOSIS — F17210 Nicotine dependence, cigarettes, uncomplicated: Secondary | ICD-10-CM | POA: Diagnosis not present

## 2016-01-29 DIAGNOSIS — Z79899 Other long term (current) drug therapy: Secondary | ICD-10-CM | POA: Diagnosis not present

## 2016-01-29 MED ORDER — ACETAMINOPHEN 325 MG PO TABS
650.0000 mg | ORAL_TABLET | Freq: Once | ORAL | Status: AC
  Administered 2016-01-29: 650 mg via ORAL
  Filled 2016-01-29: qty 2

## 2016-01-29 NOTE — ED Triage Notes (Signed)
Pt was playing with grandchild last night and fell hurting right ankle.

## 2016-01-29 NOTE — ED Provider Notes (Signed)
[facility] Emergency Department Provider Note  ____________________________________________  Time seen: Approximately 9:19 AM  I have reviewed the triage vital signs and the nursing notes.   HISTORY  Chief Complaint Ankle Pain    HPI Aidel Davisson is a 55 y.o. female who complains of right ankle pain since last night when she fell while playing with her granddaughter. Her small granddaughter was attempting to pick her up, and they both fell backward. The child fell on the patient's right foot. She's had pain since then. Worse with trying to bear weight on the leg. No other injuries. Otherwise feels fine and taking her medications.     Past Medical History:  Diagnosis Date  . Breast mass 2017   Bil marked with BB's  . Depressed   . Diabetes mellitus without complication ([place])   . Hypertension      Patient Active Problem List   Diagnosis Date Noted  . Post-traumatic stress 11/12/2015  . PVD (peripheral vascular disease) ([place]) 12/19/2014  . Uncontrolled type 2 diabetes mellitus with retinopathy, with long-term current use of insulin ([place]) 12/19/2014  . Diabetic polyneuropathy associated with type 2 diabetes mellitus ([place]) 12/19/2014  . Gastroparesis 07/20/2014  . Multiple pulmonary nodules determined by computed tomography of lung 07/20/2014  . Splenic infarct 07/17/2014  . Intractable abdominal pain 07/17/2014  . Essential hypertension 07/17/2014  . Hyperlipidemia 07/17/2014  . DM type 2, uncontrolled, with neuropathy ([place]) 07/17/2014  . GAD (generalized anxiety disorder) 06/08/2014  . MDD (major depressive disorder), recurrent episode, moderate ([place])      Past Surgical History:  Procedure Laterality Date  . colonscopy  2012   polyps  . TUBAL LIGATION    . vascular stent Right 2012   Lower leg     Prior to Admission medications   Medication Sig Start Date End Date Taking? Authorizing Provider  gabapentin (NEURONTIN) 300 MG capsule  Take 1 capsule (300 mg total) by mouth 3 (three) times daily. 11/12/15   Glean Hess, MD  glucose blood (ACCU-CHEK AVIVA) test strip Use as instructed 05/02/15   Glean Hess, MD  insulin aspart (NOVOLOG FLEXPEN) 100 UNIT/ML FlexPen Inject 10 Units into the skin 3 (three) times daily with meals. 08/30/14   Glean Hess, MD  Insulin Detemir (LEVEMIR FLEXTOUCH) 100 UNIT/ML Pen Inject 20 Units into the skin daily. 12 units in the AM and 8 units in PM 09/15/15   Glean Hess, MD  lisinopril (PRINIVIL,ZESTRIL) 10 MG tablet Take 1 tablet (10 mg total) by mouth daily. 11/12/15   Glean Hess, MD  LORazepam (ATIVAN) 0.5 MG tablet Take 1 tablet (0.5 mg total) by mouth every 8 (eight) hours as needed for anxiety. 11/12/15 11/11/16  Glean Hess, MD     Allergies Penicillins   Family History  Problem Relation Age of Onset  . Diabetes Mother   . Hypertension Mother   . Throat cancer Mother   . Thyroid disease Daughter   . Breast cancer Neg Hx     Social History Social History  Substance Use Topics  . Smoking status: Current Every Day Smoker    Packs/day: 1.00    Types: Cigarettes  . Smokeless tobacco: Never Used  . Alcohol use No    Review of Systems  Constitutional:   No fever or chills.   Cardiovascular:   No chest pain. Respiratory:   No dyspnea or cough. Gastrointestinal:   Negative for abdominal pain, vomiting and diarrhea.   Musculoskeletal:  Right ankle pain as above Neurological:   Negative for headaches 10-point ROS otherwise negative.  ____________________________________________   PHYSICAL EXAM:  VITAL SIGNS: ED Triage Vitals  Enc Vitals Group     BP 01/29/16 0829 (!) 150/82     Pulse Rate 01/29/16 0827 78     Resp 01/29/16 0827 16     Temp 01/29/16 0827 98 F (36.7 C)     Temp Source 01/29/16 0827 Oral     SpO2 01/29/16 0827 95 %     Weight 01/29/16 0828 110 lb (49.9 kg)     Height 01/29/16 0828 '5\' 3"'$  (1.6 m)     Head Circumference  --      Peak Flow --      Pain Score 01/29/16 0828 10     Pain Loc --      Pain Edu? --      Excl. in [place]? --     Vital signs reviewed, nursing assessments reviewed.   Constitutional:   Alert and oriented. Well appearing and in no distress. Eyes:   No scleral icterus. No conjunctival pallor. PERRL. EOMI.  No nystagmus. ENT   Head:   Normocephalic and atraumatic.   Nose:   No congestion/rhinnorhea. No septal hematoma   Mouth/Throat:   MMM, no pharyngeal erythema. No peritonsillar mass.    Neck:   No stridor. No SubQ emphysema. No meningismus. No midline spinal tenderness on full range of motion Cardiovascular:   RRR. Symmetric bilateral radial and DP pulses.  No murmurs.  Respiratory:   Normal respiratory effort without tachypnea nor retractions. Breath sounds are clear and equal bilaterally. No wheezes/rales/rhonchi. Gastrointestinal:   Soft and nontender. Non distended. There is no CVA tenderness.  No rebound, rigidity, or guarding. Musculoskeletal:   Full range of motion. Tenderness along the medial malleolus of the right ankle with worsened pain with inversion of the ankle. No long bone tenderness, no joint instability. Neurologic:   Normal speech and language.  CN 2-10 normal. Motor grossly intact. No gross focal neurologic deficits are appreciated.  Skin:    Skin is warm, dry and intact. No rash noted.  No petechiae, purpura, or bullae.  ____________________________________________    LABS (pertinent positives/negatives) (all labs ordered are listed, but only abnormal results are displayed) Labs Reviewed - No data to display ____________________________________________   EKG    ____________________________________________    RADIOLOGY  X-ray right ankle unremarkable  ____________________________________________   PROCEDURES Procedures  ____________________________________________   INITIAL IMPRESSION / ASSESSMENT AND PLAN / ED  COURSE  Pertinent labs & imaging results that were available during my care of the patient were reviewed by me and considered in my medical decision making (see chart for details).  Patient well appearing no acute distress. Comes to the ED by ambulance due to right ankle pain from a fall last night. No bony point tenderness on exam, consistent with sprain. X-ray negative. Will provide Ace wrap and crutches. Crutch teaching provided to prevent axis injury. Follow-up with primary care. NSAIDs, range of motion exercises, early ambulation. Counseled on rice therapy     Clinical Course    ____________________________________________   FINAL CLINICAL IMPRESSION(S) / ED DIAGNOSES  Final diagnoses:  Acute right ankle pain      New Prescriptions   No medications on file     Portions of this note were generated with dragon dictation software. Dictation errors may occur despite best attempts at proofreading.    Carrie Mew, MD 01/29/16 [phone]

## 2016-01-29 NOTE — ED Notes (Signed)
X-ray at bedside

## 2016-05-18 ENCOUNTER — Other Ambulatory Visit: Payer: Self-pay | Admitting: Internal Medicine

## 2016-05-21 ENCOUNTER — Ambulatory Visit: Payer: Medicaid Other | Admitting: Internal Medicine

## 2016-07-07 ENCOUNTER — Other Ambulatory Visit: Payer: Self-pay | Admitting: Family Medicine

## 2016-07-07 DIAGNOSIS — E1165 Type 2 diabetes mellitus with hyperglycemia: Principal | ICD-10-CM

## 2016-07-07 DIAGNOSIS — Z794 Long term (current) use of insulin: Principal | ICD-10-CM

## 2016-07-07 DIAGNOSIS — E11311 Type 2 diabetes mellitus with unspecified diabetic retinopathy with macular edema: Secondary | ICD-10-CM

## 2016-07-09 ENCOUNTER — Other Ambulatory Visit: Payer: Self-pay | Admitting: Internal Medicine

## 2016-07-09 DIAGNOSIS — Z794 Long term (current) use of insulin: Principal | ICD-10-CM

## 2016-07-09 DIAGNOSIS — E11311 Type 2 diabetes mellitus with unspecified diabetic retinopathy with macular edema: Secondary | ICD-10-CM

## 2016-07-09 DIAGNOSIS — E1165 Type 2 diabetes mellitus with hyperglycemia: Principal | ICD-10-CM

## 2016-07-09 MED ORDER — GABAPENTIN 300 MG PO CAPS: 300.0000 mg | ORAL_CAPSULE | Freq: Three times a day (TID) | ORAL | 5 refills | Status: DC

## 2016-07-16 ENCOUNTER — Ambulatory Visit (INDEPENDENT_AMBULATORY_CARE_PROVIDER_SITE_OTHER): Payer: Medicaid Other | Admitting: Internal Medicine

## 2016-07-16 ENCOUNTER — Encounter: Payer: Self-pay | Admitting: Internal Medicine

## 2016-07-16 VITALS — BP 128/62 | HR 86 | Ht 63.5 in | Wt 107.2 lb

## 2016-07-16 DIAGNOSIS — E1165 Type 2 diabetes mellitus with hyperglycemia: Secondary | ICD-10-CM

## 2016-07-16 DIAGNOSIS — F4310 Post-traumatic stress disorder, unspecified: Secondary | ICD-10-CM | POA: Diagnosis not present

## 2016-07-16 DIAGNOSIS — E11311 Type 2 diabetes mellitus with unspecified diabetic retinopathy with macular edema: Secondary | ICD-10-CM | POA: Diagnosis not present

## 2016-07-16 DIAGNOSIS — Z794 Long term (current) use of insulin: Secondary | ICD-10-CM

## 2016-07-16 DIAGNOSIS — E1142 Type 2 diabetes mellitus with diabetic polyneuropathy: Secondary | ICD-10-CM

## 2016-07-16 DIAGNOSIS — I739 Peripheral vascular disease, unspecified: Secondary | ICD-10-CM | POA: Diagnosis not present

## 2016-07-16 DIAGNOSIS — I10 Essential (primary) hypertension: Secondary | ICD-10-CM | POA: Diagnosis not present

## 2016-07-16 MED ORDER — GABAPENTIN 300 MG PO CAPS: 600.0000 mg | ORAL_CAPSULE | Freq: Three times a day (TID) | ORAL | 5 refills | Status: DC

## 2016-07-16 MED ORDER — LISINOPRIL 10 MG PO TABS: 10.0000 mg | ORAL_TABLET | Freq: Every day | ORAL | 3 refills | Status: DC

## 2016-07-16 MED ORDER — GLUCOSE BLOOD VI STRP: 1.0000 | ORAL_STRIP | Freq: Three times a day (TID) | 12 refills | Status: DC

## 2016-07-16 MED ORDER — LORAZEPAM 0.5 MG PO TABS: 0.5000 mg | ORAL_TABLET | Freq: Three times a day (TID) | ORAL | 0 refills | Status: AC | PRN

## 2016-07-16 MED ORDER — INSULIN ASPART 100 UNIT/ML FLEXPEN: 10.0000 [IU] | PEN_INJECTOR | Freq: Three times a day (TID) | SUBCUTANEOUS | 11 refills | Status: DC

## 2016-07-16 MED ORDER — CLINDAMYCIN HCL 300 MG PO CAPS: 300.0000 mg | ORAL_CAPSULE | Freq: Three times a day (TID) | ORAL | 0 refills | Status: DC

## 2016-07-16 NOTE — Progress Notes (Signed)
Date:  07/16/2016   Name:  Sabrina Wells   DOB:  July 27, 1960   MRN:  [id number]   Chief Complaint: Leg Pain (Radiating down to feet. Has burning feeling. Feet feels like there are cuts on them but no cuts are there. ) and Mouth Lesions (Popped up four days ago. Was huge. Has went down a little. Wondering if need antibiotics? Held cotten ball in mouth and blood was on it. Knows its draining.  )  Leg Pain   The quality of the pain is described as stabbing and shooting. The pain is moderate. Associated symptoms include numbness (and neuropathy).  Mouth Lesions   Associated symptoms include mouth sores. Pertinent negatives include no fever, no abdominal pain, no headaches and no cough.  Diabetes  She presents for her follow-up diabetic visit. She has type 2 diabetes mellitus. Her disease course has been fluctuating. Pertinent negatives for hypoglycemia include no headaches or tremors. Associated symptoms include foot paresthesias. Pertinent negatives for diabetes include no chest pain, no fatigue, no polydipsia and no polyuria. Diabetic complications include peripheral neuropathy. Current diabetic treatment includes insulin injections. She is compliant with treatment most of the time.   Lab Results  Component Value Date   HGBA1C 9.7 (H) 11/12/2015     Review of Systems  Constitutional: Negative for appetite change, fatigue, fever and unexpected weight change.  HENT: Positive for mouth sores. Negative for tinnitus and trouble swallowing.   Eyes: Negative for visual disturbance.  Respiratory: Negative for cough, chest tightness and shortness of breath.   Cardiovascular: Negative for chest pain, palpitations and leg swelling.  Gastrointestinal: Negative for abdominal pain.  Endocrine: Negative for polydipsia and polyuria.  Genitourinary: Negative for dysuria and hematuria.  Musculoskeletal: Negative for arthralgias.  Neurological: Positive for numbness (and neuropathy). Negative for tremors  and headaches.  Psychiatric/Behavioral: Negative for dysphoric mood.    Patient Active Problem List   Diagnosis Date Noted  . Post-traumatic stress 11/12/2015  . PVD (peripheral vascular disease) ([place]) 12/19/2014  . Uncontrolled type 2 diabetes mellitus with retinopathy, with long-term current use of insulin ([place]) 12/19/2014  . Diabetic polyneuropathy associated with type 2 diabetes mellitus ([place]) 12/19/2014  . Gastroparesis 07/20/2014  . Multiple pulmonary nodules determined by computed tomography of lung 07/20/2014  . Splenic infarct 07/17/2014  . Intractable abdominal pain 07/17/2014  . Essential hypertension 07/17/2014  . Hyperlipidemia 07/17/2014  . GAD (generalized anxiety disorder) 06/08/2014  . MDD (major depressive disorder), recurrent episode, moderate ([place])     Prior to Admission medications   Medication Sig Start Date End Date Taking? Authorizing Provider  ACCU-CHEK AVIVA PLUS test strip USE AS DIRECTED TO CHECK BLOOD SUGAR UP TO 4 TIMES A DAY 05/18/16  Yes Glean Hess, MD  gabapentin (NEURONTIN) 300 MG capsule Take 1 capsule (300 mg total) by mouth 3 (three) times daily. 07/09/16  Yes Glean Hess, MD  insulin aspart (NOVOLOG FLEXPEN) 100 UNIT/ML FlexPen Inject 10 Units into the skin 3 (three) times daily with meals. 08/30/14  Yes Glean Hess, MD  Insulin Detemir (LEVEMIR FLEXTOUCH) 100 UNIT/ML Pen Inject 20 Units into the skin daily. 12 units in the AM and 8 units in PM 09/15/15  Yes Glean Hess, MD  lisinopril (PRINIVIL,ZESTRIL) 10 MG tablet Take 1 tablet (10 mg total) by mouth daily. 11/12/15  Yes Glean Hess, MD  LORazepam (ATIVAN) 0.5 MG tablet Take 1 tablet (0.5 mg total) by mouth every 8 (eight) hours as needed for anxiety.  11/12/15 11/11/16 Yes Glean Hess, MD    Allergies  Allergen Reactions  . Penicillins Hives    Past Surgical History:  Procedure Laterality Date  . colonscopy  2012   polyps  . TUBAL LIGATION    . vascular  stent Right 2012   Lower leg    Social History  Substance Use Topics  . Smoking status: Current Every Day Smoker    Packs/day: 1.00    Types: Cigarettes  . Smokeless tobacco: Never Used  . Alcohol use No     Medication list has been reviewed and updated.   Physical Exam  Constitutional: She is oriented to person, place, and time. She appears well-developed. No distress.  HENT:  Head: Normocephalic and atraumatic.  Mouth/Throat:    Cardiovascular: Normal rate, regular rhythm and normal heart sounds.   Pulmonary/Chest: Effort normal and breath sounds normal. No respiratory distress. She has no wheezes.  Musculoskeletal: Normal range of motion.  Neurological: She is alert and oriented to person, place, and time. A sensory deficit is present.  Skin: Skin is warm, dry and intact. No rash noted.  No LE ulcerations, blisters or calluses  Psychiatric: She has a normal mood and affect. Her behavior is normal. Thought content normal.  Nursing note and vitals reviewed.   BP 128/62   Pulse 86   Ht 5' 3.5" (1.613 m)   Wt 107 lb 3.2 oz (48.6 kg)   SpO2 97%   BMI 18.69 kg/m   Assessment and Plan: 1. 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  Dispense: 180 capsule; Refill: 5 - Hemoglobin A1c - Comprehensive metabolic panel  2. Diabetic polyneuropathy associated with type 2 diabetes mellitus (HCC) Gabapentin as above  3. PVD (peripheral vascular disease) (HCC) Stable without skin breakdown  4. Essential hypertension controlled - lisinopril (PRINIVIL,ZESTRIL) 10 MG tablet; Take 1 tablet (10 mg total) by mouth daily.  Dispense: 90 tablet; Refill: 3  5. Post-traumatic stress Additional meds are given for  upcoming court date - LORazepam (ATIVAN) 0.5 MG tablet; Take 1 tablet (0.5 mg total) by mouth every 8 (eight) hours as needed for anxiety.  Dispense: 20 tablet; Refill: 0   Meds ordered this encounter  Medications  . clindamycin (CLEOCIN) 300 MG capsule    Sig: Take 1 capsule (300 mg total) by mouth 3 (three) times daily.    Dispense:  30 capsule    Refill:  0  . gabapentin (NEURONTIN) 300 MG capsule    Sig: Take 2 capsules (600 mg total) by mouth 3 (three) times daily.    Dispense:  180 capsule    Refill:  5  . lisinopril (PRINIVIL,ZESTRIL) 10 MG tablet    Sig: Take 1 tablet (10 mg total) by mouth daily.    Dispense:  90 tablet    Refill:  3  . insulin aspart (NOVOLOG FLEXPEN) 100 UNIT/ML FlexPen    Sig: Inject 10 Units into the skin 3 (three) times daily with meals.    Dispense:  15 mL    Refill:  11  . LORazepam (ATIVAN) 0.5 MG tablet    Sig: Take 1 tablet (0.5 mg total) by mouth every 8 (eight) hours as needed for anxiety.    Dispense:  20 tablet    Refill:  0  . glucose blood (ACCU-CHEK AVIVA PLUS) test strip    Sig: 1 each by Other route 3 (three) times daily. Use as instructed    Dispense:  100 each    Refill:  [place], MD [facility]  07/16/2016

## 2016-07-17 LAB — COMPREHENSIVE METABOLIC PANEL
ALBUMIN: 4 g/dL (ref 3.5–5.5)
ALT: 11 IU/L (ref 0–32)
AST: 15 IU/L (ref 0–40)
Albumin/Globulin Ratio: 1.3 (ref 1.2–2.2)
Alkaline Phosphatase: 93 IU/L (ref 39–117)
BUN / CREAT RATIO: 17 (ref 9–23)
BUN: 13 mg/dL (ref 6–24)
Bilirubin Total: <0.2 mg/dL (ref 0.0–1.2)
CO2: 25 mmol/L (ref 20–29)
CREATININE: 0.76 mg/dL (ref 0.57–1.00)
Calcium: 8.8 mg/dL (ref 8.7–10.2)
Chloride: 98 mmol/L (ref 96–106)
GFR, EST AFRICAN AMERICAN: 100 mL/min/{1.73_m2} (ref >59)
GFR, EST NON AFRICAN AMERICAN: 87 mL/min/{1.73_m2} (ref >59)
GLUCOSE: 141 mg/dL — AB (ref 65–99)
Globulin, Total: 3 g/dL (ref 1.5–4.5)
Potassium: 5.3 mmol/L — ABNORMAL HIGH (ref 3.5–5.2)
Sodium: 137 mmol/L (ref 134–144)
TOTAL PROTEIN: 7 g/dL (ref 6.0–8.5)

## 2016-07-17 LAB — HEMOGLOBIN A1C
Est. average glucose Bld gHb Est-mCnc: 217 mg/dL
Hgb A1c MFr Bld: 9.2 % — ABNORMAL HIGH (ref 4.8–5.6)

## 2016-08-31 ENCOUNTER — Encounter: Payer: Self-pay | Admitting: Physician Assistant

## 2016-08-31 ENCOUNTER — Ambulatory Visit (INDEPENDENT_AMBULATORY_CARE_PROVIDER_SITE_OTHER): Payer: Medicaid Other | Admitting: Physician Assistant

## 2016-08-31 VITALS — BP 138/64 | HR 74 | Ht 63.5 in | Wt 108.0 lb

## 2016-08-31 DIAGNOSIS — K1370 Unspecified lesions of oral mucosa: Secondary | ICD-10-CM | POA: Diagnosis not present

## 2016-08-31 DIAGNOSIS — F4310 Post-traumatic stress disorder, unspecified: Secondary | ICD-10-CM | POA: Diagnosis not present

## 2016-08-31 MED ORDER — CLINDAMYCIN HCL 300 MG PO CAPS: 300.0000 mg | ORAL_CAPSULE | Freq: Three times a day (TID) | ORAL | 0 refills | Status: AC
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
## 2016-08-31 NOTE — Patient Instructions (Signed)
Dental Abscess A dental abscess is pus in or around a tooth. Follow these instructions at home:  Take medicines only as told by your dentist.  If you were prescribed antibiotic medicine, finish all of it even if you start to feel better.  Rinse your mouth (gargle) often with salt water.  Do not drive or use heavy machinery, like a lawn mower, while taking pain medicine.  Do not apply heat to the outside of your mouth.  Keep all follow-up visits as told by your dentist. This is important. Contact a doctor if:  Your pain is worse, and medicine does not help. Get help right away if:  You have a fever or chills.  Your symptoms suddenly get worse.  You have a very bad headache.  You have problems breathing or swallowing.  You have trouble opening your mouth.  You have puffiness (swelling) in your neck or around your eye. This information is not intended to replace advice given to you by your health care provider. Make sure you discuss any questions you have with your health care provider. Document Released: 06/04/2014 Document Revised: 06/26/2015 Document Reviewed: 01/15/2014 Elsevier Interactive Patient Education  [employer].

## 2016-10-02 ENCOUNTER — Other Ambulatory Visit: Payer: Self-pay | Admitting: Family Medicine

## 2016-10-05 NOTE — Telephone Encounter (Signed)
Contacted pt and she states she has an [facility] on the [facility] of this [facility]? She will set up her apt after she has been seen.

## 2017-01-14 ENCOUNTER — Other Ambulatory Visit: Payer: Self-pay | Admitting: Internal Medicine

## 2017-01-14 NOTE — Telephone Encounter (Signed)
Patient will call back to set up appt.

## 2017-01-27 ENCOUNTER — Emergency Department
Admission: EM | Admit: 2017-01-27 | Discharge: 2017-01-27 | Disposition: A | Discharge status: Home / Self Care | Payer: Medicaid Other | Attending: Emergency Medicine | Admitting: Emergency Medicine

## 2017-01-27 ENCOUNTER — Encounter: Payer: Self-pay | Admitting: Intensive Care

## 2017-01-27 DIAGNOSIS — F17210 Nicotine dependence, cigarettes, uncomplicated: Secondary | ICD-10-CM | POA: Diagnosis not present

## 2017-01-27 DIAGNOSIS — I10 Essential (primary) hypertension: Secondary | ICD-10-CM | POA: Insufficient documentation

## 2017-01-27 DIAGNOSIS — E1140 Type 2 diabetes mellitus with diabetic neuropathy, unspecified: Secondary | ICD-10-CM | POA: Insufficient documentation

## 2017-01-27 DIAGNOSIS — L03031 Cellulitis of right toe: Secondary | ICD-10-CM | POA: Diagnosis not present

## 2017-01-27 DIAGNOSIS — Z794 Long term (current) use of insulin: Secondary | ICD-10-CM | POA: Insufficient documentation

## 2017-01-27 DIAGNOSIS — Z79899 Other long term (current) drug therapy: Secondary | ICD-10-CM | POA: Diagnosis not present

## 2017-01-27 DIAGNOSIS — L089 Local infection of the skin and subcutaneous tissue, unspecified: Secondary | ICD-10-CM

## 2017-01-27 DIAGNOSIS — M79674 Pain in right toe(s): Secondary | ICD-10-CM | POA: Diagnosis present

## 2017-01-27 HISTORY — DX: Unspecified disorder of circulatory system: I99.9

## 2017-01-27 HISTORY — DX: Polyneuropathy, unspecified: G62.9

## 2017-01-27 LAB — COMPREHENSIVE METABOLIC PANEL
ALT: 12 U/L — AB (ref 14–54)
AST: 21 U/L (ref 15–41)
Albumin: 3.7 g/dL (ref 3.5–5.0)
Alkaline Phosphatase: 84 U/L (ref 38–126)
Anion gap: 3 — ABNORMAL LOW (ref 5–15)
BUN: 14 mg/dL (ref 6–20)
CO2: 27 mmol/L (ref 22–32)
CREATININE: 0.58 mg/dL (ref 0.44–1.00)
Calcium: 8 mg/dL — ABNORMAL LOW (ref 8.9–10.3)
Chloride: 104 mmol/L (ref 101–111)
GFR calc non Af Amer: >60 mL/min (ref >60)
Glucose, Bld: 213 mg/dL — ABNORMAL HIGH (ref 65–99)
POTASSIUM: 4.1 mmol/L (ref 3.5–5.1)
Sodium: 134 mmol/L — ABNORMAL LOW (ref 135–145)
Total Bilirubin: 0.4 mg/dL (ref 0.3–1.2)
Total Protein: 6.7 g/dL (ref 6.5–8.1)

## 2017-01-27 LAB — CBC WITH DIFFERENTIAL/PLATELET
Basophils Absolute: 0.1 10*3/uL (ref 0–0.1)
Basophils Relative: 1 %
EOS ABS: 0.3 10*3/uL (ref 0–0.7)
Eosinophils Relative: 3 %
HEMATOCRIT: 42.1 % (ref 35.0–47.0)
HEMOGLOBIN: 14.2 g/dL (ref 12.0–16.0)
LYMPHS ABS: 2.8 10*3/uL (ref 1.0–3.6)
LYMPHS PCT: 29 %
MCH: 30.3 pg (ref 26.0–34.0)
MCHC: 33.8 g/dL (ref 32.0–36.0)
MCV: 89.7 fL (ref 80.0–100.0)
MONOS PCT: 8 %
Monocytes Absolute: 0.7 10*3/uL (ref 0.2–0.9)
NEUTROS ABS: 5.5 10*3/uL (ref 1.4–6.5)
NEUTROS PCT: 59 %
Platelets: 379 10*3/uL (ref 150–440)
RBC: 4.69 MIL/uL (ref 3.80–5.20)
RDW: 14.9 % — ABNORMAL HIGH (ref 11.5–14.5)
WBC: 9.4 10*3/uL (ref 3.6–11.0)

## 2017-01-27 LAB — URINALYSIS, COMPLETE (UACMP) WITH MICROSCOPIC
BACTERIA UA: NONE SEEN
Bilirubin Urine: NEGATIVE
Glucose, UA: NEGATIVE mg/dL
Hgb urine dipstick: NEGATIVE
KETONES UR: NEGATIVE mg/dL
Leukocytes, UA: NEGATIVE
Nitrite: NEGATIVE
PROTEIN: 100 mg/dL — AB
Specific Gravity, Urine: 1.017 (ref 1.005–1.030)
pH: 5 (ref 5.0–8.0)

## 2017-01-27 LAB — LACTIC ACID, PLASMA: LACTIC ACID, VENOUS: 0.8 mmol/L (ref 0.5–1.9)

## 2017-01-27 MED ORDER — DOXYCYCLINE HYCLATE 100 MG PO TABS: 100.0000 mg | ORAL_TABLET | Freq: Two times a day (BID) | ORAL | 0 refills | Status: DC

## 2017-01-27 MED ORDER — TRAMADOL HCL 50 MG PO TABS: 50.0000 mg | ORAL_TABLET | Freq: Four times a day (QID) | ORAL | 0 refills | Status: AC | PRN

## 2017-01-27 NOTE — ED Notes (Signed)
Pt to ed with c/o right foot second toe pain, swelling, redness and with green discharge x 3 weeks.  Pt states hx of diabetes.

## 2017-01-27 NOTE — ED Provider Notes (Signed)
[facility] Emergency Department Provider Note   ____________________________________________    I have reviewed the triage vital signs and the nursing notes.   HISTORY  Chief Complaint Toe infection    HPI Sabrina Wells is a 56 y.o. female who presents with complaints of swelling and redness and pain to her right second toe.  Patient reports her toe was scratched 3 weeks ago and recently has started to turn reddish and she has a burning sensation along the distal dorsal portion of the right second toe.  Denies fevers or chills.  She does have a history of diabetes that she is concerned about infections.  Has been treating with hydrogen peroxide, has not taken any medications for.  Past Medical History:  Diagnosis Date  . Breast mass 2017   Bil marked with BB's  . Depressed   . Diabetes mellitus without complication ([place])   . Hypertension   . Neuropathy   . Vascular disease     Patient Active Problem List   Diagnosis Date Noted  . Post-traumatic stress 11/12/2015  . PVD (peripheral vascular disease) ([place]) 12/19/2014  . Uncontrolled type 2 diabetes mellitus with retinopathy, with long-term current use of insulin ([place]) 12/19/2014  . Diabetic polyneuropathy associated with type 2 diabetes mellitus ([place]) 12/19/2014  . Gastroparesis 07/20/2014  . Multiple pulmonary nodules determined by computed tomography of lung 07/20/2014  . Splenic infarct 07/17/2014  . Intractable abdominal pain 07/17/2014  . Essential hypertension 07/17/2014  . Hyperlipidemia 07/17/2014  . GAD (generalized anxiety disorder) 06/08/2014  . MDD (major depressive disorder), recurrent episode, moderate ([place])     Past Surgical History:  Procedure Laterality Date  . colonscopy  2012   polyps  . TUBAL LIGATION    . vascular stent Right 2012   Lower leg    Prior to Admission medications   Medication Sig Start Date End Date Taking? Authorizing Provider  doxycycline  (VIBRA-TABS) 100 MG tablet Take 1 tablet (100 mg total) by mouth 2 (two) times daily. 01/27/17   Lavonia Drafts, MD  gabapentin (NEURONTIN) 300 MG capsule Take 2 capsules (600 mg total) by mouth 3 (three) times daily. 07/16/16   Glean Hess, MD  glucose blood (ACCU-CHEK AVIVA PLUS) test strip 1 each by Other route 3 (three) times daily. Use as instructed 07/16/16   Glean Hess, MD  insulin aspart (NOVOLOG FLEXPEN) 100 UNIT/ML FlexPen Inject 10 Units into the skin 3 (three) times daily with meals. 07/16/16   Glean Hess, MD  Insulin Detemir (LEVEMIR FLEXTOUCH) 100 UNIT/ML Pen 15 U qAM and 11 U qPM 01/14/17   Glean Hess, MD  lisinopril (PRINIVIL,ZESTRIL) 10 MG tablet Take 1 tablet (10 mg total) by mouth daily. Patient not taking: Reported on 08/31/2016 07/16/16   Glean Hess, MD  LORazepam (ATIVAN) 0.5 MG tablet Take 1 tablet (0.5 mg total) by mouth every 8 (eight) hours as needed for anxiety. 07/16/16 07/16/17  Glean Hess, MD  traMADol (ULTRAM) 50 MG tablet Take 1 tablet (50 mg total) by mouth every 6 (six) hours as needed. 01/27/17 01/27/18  Lavonia Drafts, MD     Allergies Penicillins  Family History  Problem Relation Age of Onset  . Diabetes Mother   . Hypertension Mother   . Throat cancer Mother   . Thyroid disease Daughter   . Breast cancer Neg Hx     Social History Social History   Tobacco Use  . Smoking status: Current Every Day  Smoker    Packs/day: 1.00    Types: Cigarettes  . Smokeless tobacco: Never Used  Substance Use Topics  . Alcohol use: No    Alcohol/week: 1.2 oz    Types: 2 Standard drinks or equivalent per week  . Drug use: Yes    Types: Marijuana    Comment: occassional     Review of Systems  Constitutional: No fever/chills Eyes: No visual changes.  ENT: No sore throat. Cardiovascular: Denies chest pain. Respiratory: Denies shortness of breath. Gastrointestinal: No abdominal pain. Genitourinary: Negative for  dysuria. Musculoskeletal: Toe pain as above Skin: As above Neurological: Negative for headaches   ____________________________________________   PHYSICAL EXAM:  VITAL SIGNS: ED Triage Vitals [01/27/17 1446]  Enc Vitals Group     BP (!) 162/72     Pulse Rate 90     Resp 16     Temp 97.6 F (36.4 C)     Temp Source Oral     SpO2 100 %     Weight 49.9 kg (110 lb)     Height 1.6 m (5\' 3" )     Head Circumference      Peak Flow      Pain Score 10     Pain Loc      Pain Edu?      Excl. in [place]?     Constitutional: Alert and oriented. No acute distress. Pleasant and interactive   Nose: No congestion/rhinnorhea. Mouth/Throat: Mucous membranes are moist.  Poor dentition Cardiovascular: Normal rate, regular rhythm.  Good peripheral circulation. Respiratory: Normal respiratory effort.  No retractions.Gastrointestinal: Soft and nontender. No distention.  No CVA tenderness. Genitourinary: deferred Musculoskeletal:   Warm and well perfused.  Neurologic:  Normal speech and language. No gross focal neurologic deficits are appreciated.  Skin:  Skin is warm, dry and intact.  Right second toe, mild erythema on dorsal surface just proximal to the nail, somewhat concerning for paronychia although no fluctuance. Psychiatric: Mood and affect are normal. Speech and behavior are normal.  ____________________________________________   LABS (all labs ordered are listed, but only abnormal results are displayed)  Labs Reviewed  COMPREHENSIVE METABOLIC PANEL - Abnormal; Notable for the following components:      Result Value   Sodium 134 (*)    Glucose, Bld 213 (*)    Calcium 8.0 (*)    ALT 12 (*)    Anion gap 3 (*)    All other components within normal limits  CBC WITH DIFFERENTIAL/PLATELET - Abnormal; Notable for the following components:   RDW 14.9 (*)    All other components within normal limits  URINALYSIS, COMPLETE (UACMP) WITH MICROSCOPIC - Abnormal; Notable for the following  components:   Color, Urine YELLOW (*)    APPearance CLEAR (*)    Protein, ur 100 (*)    Squamous Epithelial / LPF 0-5 (*)    All other components within normal limits  LACTIC ACID, PLASMA  LACTIC ACID, PLASMA   ____________________________________________  EKG  None ____________________________________________  RADIOLOGY  None ____________________________________________   PROCEDURES  Procedure(s) performed: yes  .Marland KitchenIncision and Drainage Date/Time: 01/27/2017 6:08 PM Performed by: Lavonia Drafts, MD Authorized by: Lavonia Drafts, MD   Consent:    Consent obtained:  Verbal   Consent given by:  Patient   Risks discussed:  Pain   Alternatives discussed:  No treatment Location:    Location:  Lower extremity   Lower extremity location:  Toe   Toe location:  R second toe Procedure type:  Complexity:  Simple Procedure details:    Needle aspiration: no     Incision types:  Stab incision   Incision depth:  Dermal   Scalpel blade:  11   Drainage:  Bloody   Drainage amount:  Scant Post-procedure details:    Patient tolerance of procedure:  Tolerated well, no immediate complications     Critical Care performed: No ____________________________________________   INITIAL IMPRESSION / ASSESSMENT AND PLAN / ED COURSE  Pertinent labs & imaging results that were available during my care of the patient were reviewed by me and considered in my medical decision making (see chart for details).  Exam seems to be consistent with paronychia, 11 blade used to incise the pocket however only scant bloody discharge.  We will place the patient on doxycycline, she has follow-up with her PCP arranged in 3 days.    ____________________________________________   FINAL CLINICAL IMPRESSION(S) / ED DIAGNOSES  Final diagnoses:  Toe infection        Note:  This document was prepared using Dragon voice recognition software and may include unintentional dictation errors.     Lavonia Drafts, MD 01/27/17 [phone]

## 2017-01-27 NOTE — ED Triage Notes (Signed)
Patient presents with R foot, second toe infection X3 weeks. HX diabetes. Toe is swollen and yellow puss presents around nail

## 2017-01-31 ENCOUNTER — Ambulatory Visit: Payer: Self-pay | Admitting: Internal Medicine

## 2017-02-04 ENCOUNTER — Encounter: Payer: Self-pay | Admitting: Internal Medicine

## 2017-02-04 ENCOUNTER — Ambulatory Visit (INDEPENDENT_AMBULATORY_CARE_PROVIDER_SITE_OTHER): Payer: Medicaid Other | Admitting: Internal Medicine

## 2017-02-04 VITALS — BP 122/60 | HR 97 | Ht 63.0 in | Wt 110.0 lb

## 2017-02-04 DIAGNOSIS — Z794 Long term (current) use of insulin: Secondary | ICD-10-CM | POA: Diagnosis not present

## 2017-02-04 DIAGNOSIS — L602 Onychogryphosis: Secondary | ICD-10-CM

## 2017-02-04 DIAGNOSIS — E11319 Type 2 diabetes mellitus with unspecified diabetic retinopathy without macular edema: Secondary | ICD-10-CM

## 2017-02-04 DIAGNOSIS — S91109A Unspecified open wound of unspecified toe(s) without damage to nail, initial encounter: Secondary | ICD-10-CM | POA: Diagnosis not present

## 2017-02-04 DIAGNOSIS — Z23 Encounter for immunization: Secondary | ICD-10-CM | POA: Diagnosis not present

## 2017-02-04 DIAGNOSIS — E1165 Type 2 diabetes mellitus with hyperglycemia: Secondary | ICD-10-CM | POA: Diagnosis not present

## 2017-02-04 DIAGNOSIS — IMO0002 Reserved for concepts with insufficient information to code with codable children: Secondary | ICD-10-CM

## 2017-02-04 DIAGNOSIS — L03031 Cellulitis of right toe: Secondary | ICD-10-CM

## 2017-02-04 MED ORDER — CEPHALEXIN 500 MG PO CAPS: 500.0000 mg | ORAL_CAPSULE | Freq: Four times a day (QID) | ORAL | 0 refills | Status: AC

## 2017-02-04 NOTE — Patient Instructions (Addendum)
Tdap Vaccine (Tetanus, Diphtheria and Pertussis): What You Need to Know 1. Why get vaccinated? Tetanus, diphtheria and pertussis are very serious diseases. Tdap vaccine can protect us from these diseases. And, Tdap vaccine given to pregnant women can protect newborn babies against pertussis. TETANUS (Lockjaw) is rare in the [place] today. It causes painful muscle tightening and stiffness, usually all over the body.  It can lead to tightening of muscles in the head and neck so you can't open your mouth, swallow, or sometimes even breathe. Tetanus kills about 1 out of 10 people who are infected even after receiving the best medical care.  DIPHTHERIA is also rare in the [place] today. It can cause a thick coating to form in the back of the throat.  It can lead to breathing problems, heart failure, paralysis, and death.  PERTUSSIS (Whooping Cough) causes severe coughing spells, which can cause difficulty breathing, vomiting and disturbed sleep.  It can also lead to weight loss, incontinence, and rib fractures. Up to 2 in 100 adolescents and 5 in 100 adults with pertussis are hospitalized or have complications, which could include pneumonia or death.  These diseases are caused by bacteria. Diphtheria and pertussis are spread from person to person through secretions from coughing or sneezing. Tetanus enters the body through cuts, scratches, or wounds. Before vaccines, as many as 200,000 cases of diphtheria, 200,000 cases of pertussis, and hundreds of cases of tetanus, were reported in the [place] each year. Since vaccination began, reports of cases for tetanus and diphtheria have dropped by about 99% and for pertussis by about 80%. 2. Tdap vaccine Tdap vaccine can protect adolescents and adults from tetanus, diphtheria, and pertussis. One dose of Tdap is routinely given at age 11 or 12. People who did not get Tdap at that age should get it as soon as possible. Tdap is especially  important for healthcare professionals and anyone having close contact with a baby younger than 12 months. Pregnant women should get a dose of Tdap during every pregnancy, to protect the newborn from pertussis. Infants are most at risk for severe, life-threatening complications from pertussis. Another vaccine, called Td, protects against tetanus and diphtheria, but not pertussis. A Td booster should be given every 10 years. Tdap may be given as one of these boosters if you have never gotten Tdap before. Tdap may also be given after a severe cut or burn to prevent tetanus infection. Your doctor or the person giving you the vaccine can give you more information. Tdap may safely be given at the same time as other vaccines. 3. Some people should not get this vaccine  A person who has ever had a life-threatening allergic reaction after a previous dose of any diphtheria, tetanus or pertussis containing vaccine, OR has a severe allergy to any part of this vaccine, should not get Tdap vaccine. Tell the person giving the vaccine about any severe allergies.  Anyone who had coma or long repeated seizures within 7 days after a childhood dose of DTP or DTaP, or a previous dose of Tdap, should not get Tdap, unless a cause other than the vaccine was found. They can still get Td.  Talk to your doctor if you: ? have seizures or another nervous system problem, ? had severe pain or swelling after any vaccine containing diphtheria, tetanus or pertussis, ? ever had a condition called Guillain-Barr Syndrome (GBS), ? aren't feeling well on the day the shot is scheduled. 4. Risks With any medicine, including   vaccines, there is a chance of side effects. These are usually mild and go away on their own. Serious reactions are also possible but are rare. Most people who get Tdap vaccine do not have any problems with it. Mild problems following Tdap: (Did not interfere with activities)  Pain where the shot was given (about  3 in 4 adolescents or 2 in 3 adults)  Redness or swelling where the shot was given (about 1 person in 5)  Mild fever of at least 100.4F (up to about 1 in 25 adolescents or 1 in 100 adults)  Headache (about 3 or 4 people in 10)  Tiredness (about 1 person in 3 or 4)  Nausea, vomiting, diarrhea, stomach ache (up to 1 in 4 adolescents or 1 in 10 adults)  Chills, sore joints (about 1 person in 10)  Body aches (about 1 person in 3 or 4)  Rash, swollen glands (uncommon)  Moderate problems following Tdap: (Interfered with activities, but did not require medical attention)  Pain where the shot was given (up to 1 in 5 or 6)  Redness or swelling where the shot was given (up to about 1 in 16 adolescents or 1 in 12 adults)  Fever over 102F (about 1 in 100 adolescents or 1 in 250 adults)  Headache (about 1 in 7 adolescents or 1 in 10 adults)  Nausea, vomiting, diarrhea, stomach ache (up to 1 or 3 people in 100)  Swelling of the entire arm where the shot was given (up to about 1 in 500).  Severe problems following Tdap: (Unable to perform usual activities; required medical attention)  Swelling, severe pain, bleeding and redness in the arm where the shot was given (rare).  Problems that could happen after any vaccine:  People sometimes faint after a medical procedure, including vaccination. Sitting or lying down for about 15 minutes can help prevent fainting, and injuries caused by a fall. Tell your doctor if you feel dizzy, or have vision changes or ringing in the ears.  Some people get severe pain in the shoulder and have difficulty moving the arm where a shot was given. This happens very rarely.  Any medication can cause a severe allergic reaction. Such reactions from a vaccine are very rare, estimated at fewer than 1 in a million doses, and would happen within a few minutes to a few hours after the vaccination. As with any medicine, there is a very remote chance of a vaccine  causing a serious injury or death. The safety of vaccines is always being monitored. For more information, visit: www.cdc.gov/vaccinesafety/ 5. What if there is a serious problem? What should I look for? Look for anything that concerns you, such as signs of a severe allergic reaction, very high fever, or unusual behavior. Signs of a severe allergic reaction can include hives, swelling of the face and throat, difficulty breathing, a fast heartbeat, dizziness, and weakness. These would usually start a few minutes to a few hours after the vaccination. What should I do?  If you think it is a severe allergic reaction or other emergency that can't wait, call 9-1-1 or get the person to the nearest hospital. Otherwise, call your doctor.  Afterward, the reaction should be reported to the Vaccine Adverse Event Reporting System (VAERS). Your doctor might file this report, or you can do it yourself through the VAERS web site at www.vaers.hhs.gov, or by calling 1-800-822-7967. ? VAERS does not give medical advice. 6. The National Vaccine Injury Compensation Program The National   Vaccine Injury Compensation Program (VICP) is a federal program that was created to compensate people who may have been injured by certain vaccines. Persons who believe they may have been injured by a vaccine can learn about the program and about filing a claim by calling 1-800-338-2382 or visiting the VICP website at www.hrsa.gov/vaccinecompensation. There is a time limit to file a claim for compensation. 7. How can I learn more?  Ask your doctor. He or she can give you the vaccine package insert or suggest other sources of information.  Call your local or state health department.  Contact the Centers for Disease Control and Prevention (CDC): ? Call 1-800-232-4636 (1-800-CDC-INFO) or ? Visit CDC's website at www.cdc.gov/vaccines CDC Tdap Vaccine VIS (03/27/13) This information is not intended to replace advice given to you by your  health care provider. Make sure you discuss any questions you have with your health care provider. Document Released: 07/20/2011 Document Revised: 10/09/2015 Document Reviewed: 10/09/2015 Elsevier Interactive Patient Education  2017 [employer].  

## 2017-02-04 NOTE — Progress Notes (Signed)
Date:  02/04/2017   Name:  Sabrina Wells   DOB:  1961-01-01   MRN:  [id number]   Chief Complaint: Toe Pain (Rt 2nd toe infected- started 3 weeks ago. Seen ER and they gave her tramadol and doxy antibiotic for toe. Only a little better. Still painful and burning. )  Toe Pain   The incident occurred at home (3 weeks ago). Injury mechanism: she thinks it was a cat scratch. The pain is present in the right toes (distal left second toe). The quality of the pain is described as burning. The pain is mild. Associated symptoms include numbness (and pain).   DM - continues on insulin therapy.  Due for A1C.   Taking gabapentin only bid due to somnolence but still having some neuropathic pain in legs.  Recommend increasing evening dose to 3 capsules.   Review of Systems  Constitutional: Negative for appetite change, fatigue, fever and unexpected weight change.  HENT: Negative for tinnitus and trouble swallowing.   Eyes: Negative for visual disturbance.  Respiratory: Negative for cough, chest tightness and shortness of breath.   Cardiovascular: Negative for chest pain, palpitations and leg swelling.  Gastrointestinal: Negative for abdominal pain.  Endocrine: Negative for polydipsia and polyuria.  Genitourinary: Negative for dysuria and hematuria.  Musculoskeletal: Negative for arthralgias.  Skin: Positive for color change and wound.  Neurological: Positive for numbness (and pain). Negative for tremors and headaches.  Psychiatric/Behavioral: Negative for dysphoric mood.    Patient Active Problem List   Diagnosis Date Noted  . Post-traumatic stress 11/12/2015  . PVD (peripheral vascular disease) ([place]) 12/19/2014  . Uncontrolled type 2 diabetes mellitus with retinopathy, with long-term current use of insulin ([place]) 12/19/2014  . Diabetic polyneuropathy associated with type 2 diabetes mellitus ([place]) 12/19/2014  . Gastroparesis 07/20/2014  . Multiple pulmonary nodules determined by computed  tomography of lung 07/20/2014  . Splenic infarct 07/17/2014  . Intractable abdominal pain 07/17/2014  . Essential hypertension 07/17/2014  . Hyperlipidemia 07/17/2014  . GAD (generalized anxiety disorder) 06/08/2014  . MDD (major depressive disorder), recurrent episode, moderate ([place])     Prior to Admission medications   Medication Sig Start Date End Date Taking? Authorizing Provider  gabapentin (NEURONTIN) 300 MG capsule Take 2 capsules (600 mg total) by mouth 3 (three) times daily. 07/16/16  Yes Glean Hess, MD  glucose blood (ACCU-CHEK AVIVA PLUS) test strip 1 each by Other route 3 (three) times daily. Use as instructed 07/16/16  Yes Glean Hess, MD  insulin aspart (NOVOLOG FLEXPEN) 100 UNIT/ML FlexPen Inject 10 Units into the skin 3 (three) times daily with meals. 07/16/16  Yes Glean Hess, MD  Insulin Detemir (LEVEMIR FLEXTOUCH) 100 UNIT/ML Pen 15 U qAM and 11 U qPM Patient taking differently: 12 units in the morning- 8 unit at night 01/14/17  Yes Glean Hess, MD  lisinopril (PRINIVIL,ZESTRIL) 10 MG tablet Take 1 tablet (10 mg total) by mouth daily. 07/16/16  Yes Glean Hess, MD  LORazepam (ATIVAN) 0.5 MG tablet Take 1 tablet (0.5 mg total) by mouth every 8 (eight) hours as needed for anxiety. 07/16/16 07/16/17 Yes Glean Hess, MD  traMADol (ULTRAM) 50 MG tablet Take 1 tablet (50 mg total) by mouth every 6 (six) hours as needed. 01/27/17 01/27/18 Yes Lavonia Drafts, MD    Allergies  Allergen Reactions  . Penicillins Hives    Past Surgical History:  Procedure Laterality Date  . colonscopy  2012   polyps  . TUBAL  LIGATION    . vascular stent Right 2012   Lower leg    Social History   Tobacco Use  . Smoking status: Current Every Day Smoker    Packs/day: 1.00    Types: Cigarettes  . Smokeless tobacco: Never Used  Substance Use Topics  . Alcohol use: No    Alcohol/week: 1.2 oz    Types: 2 Standard drinks or equivalent per week  . Drug use:  Yes    Types: Marijuana    Comment: occassional      Medication list has been reviewed and updated.  PHQ 2/9 Scores 02/04/2017 07/16/2016  PHQ - 2 Score 0 1  PHQ- 9 Score 0 1    Physical Exam  Constitutional: She is oriented to person, place, and time. She appears well-developed. No distress.  HENT:  Head: Normocephalic and atraumatic.  Pulmonary/Chest: Effort normal. No respiratory distress.  Musculoskeletal: Normal range of motion.  Distal second right toe with eschar, mild erythema and mild tenderness/warmth.  No fluctuance or drainage.  Neurological: She is alert and oriented to person, place, and time.  Skin: Skin is warm and dry. No rash noted.  Thickened dystrophic toenails on right  Psychiatric: She has a normal mood and affect. Her behavior is normal. Thought content normal.  Nursing note and vitals reviewed.   BP 122/60   Pulse 97   Ht 5\' 3"  (1.6 m)   Wt 110 lb (49.9 kg)   SpO2 100%   BMI 19.49 kg/m   Assessment and Plan: 1. Thickened nails - Ambulatory referral to Podiatry  2. Uncontrolled type 2 diabetes mellitus with retinopathy, with long-term current use of insulin (HCC) Continue insulin Change dosing of gabapentin - Hemoglobin A1c  3. Cellulitis of toe of right foot Additional antibiotics needed - cephALEXin (KEFLEX) 500 MG capsule; Take 1 capsule (500 mg total) by mouth 4 (four) times daily for 7 days.  Dispense: 28 capsule; Refill: 0  4. Open toe wound, initial encounter - Tdap vaccine greater than or equal to 7yo IM   Meds ordered this encounter  Medications  . cephALEXin (KEFLEX) 500 MG capsule    Sig: Take 1 capsule (500 mg total) by mouth 4 (four) times daily for 7 days.    Dispense:  28 capsule    Refill:  0    Partially dictated using Editor, commissioning. Any errors are unintentional.  Halina Maidens, MD [facility]  02/04/2017

## 2017-02-05 LAB — HEMOGLOBIN A1C
Est. average glucose Bld gHb Est-mCnc: 197 mg/dL
HEMOGLOBIN A1C: 8.5 % — AB (ref 4.8–5.6)

## 2017-02-07 ENCOUNTER — Other Ambulatory Visit: Payer: Self-pay | Admitting: Internal Medicine

## 2017-02-07 DIAGNOSIS — IMO0002 Reserved for concepts with insufficient information to code with codable children: Secondary | ICD-10-CM

## 2017-02-07 DIAGNOSIS — Z794 Long term (current) use of insulin: Principal | ICD-10-CM

## 2017-02-07 DIAGNOSIS — E1165 Type 2 diabetes mellitus with hyperglycemia: Principal | ICD-10-CM

## 2017-02-07 DIAGNOSIS — E11311 Type 2 diabetes mellitus with unspecified diabetic retinopathy with macular edema: Secondary | ICD-10-CM

## 2017-03-01 NOTE — Telephone Encounter (Signed)
paitient scheduled for July 5.

## 2017-03-09 DIAGNOSIS — B351 Tinea unguium: Secondary | ICD-10-CM | POA: Diagnosis not present

## 2017-03-09 DIAGNOSIS — E1042 Type 1 diabetes mellitus with diabetic polyneuropathy: Secondary | ICD-10-CM | POA: Diagnosis not present

## 2017-03-09 DIAGNOSIS — L851 Acquired keratosis [keratoderma] palmaris et plantaris: Secondary | ICD-10-CM | POA: Diagnosis not present

## 2017-03-09 DIAGNOSIS — L03031 Cellulitis of right toe: Secondary | ICD-10-CM | POA: Diagnosis not present

## 2017-03-09 DIAGNOSIS — I739 Peripheral vascular disease, unspecified: Secondary | ICD-10-CM | POA: Diagnosis not present

## 2017-03-10 DIAGNOSIS — E119 Type 2 diabetes mellitus without complications: Secondary | ICD-10-CM | POA: Diagnosis not present

## 2017-04-19 ENCOUNTER — Other Ambulatory Visit: Payer: Self-pay | Admitting: Internal Medicine

## 2017-08-05 ENCOUNTER — Ambulatory Visit: Payer: Self-pay | Admitting: Internal Medicine

## 2017-08-05 NOTE — Progress Notes (Deleted)
    Date:  08/05/2017   Name:  Sabrina Wells   DOB:  05-12-1960   MRN:  [id number]   Chief Complaint: No chief complaint on file. Diabetes  She presents for her follow-up diabetic visit. She has type 2 diabetes mellitus. Current diabetic treatment includes insulin injections. An ACE inhibitor/angiotensin II receptor blocker is being taken.  Hypertension       Review of Systems  Patient Active Problem List   Diagnosis Date Noted  . Post-traumatic stress 11/12/2015  . PVD (peripheral vascular disease) ([place]) 12/19/2014  . Uncontrolled type 2 diabetes mellitus with retinopathy, with long-term current use of insulin ([place]) 12/19/2014  . Diabetic polyneuropathy associated with type 2 diabetes mellitus ([place]) 12/19/2014  . Gastroparesis 07/20/2014  . Multiple pulmonary nodules determined by computed tomography of lung 07/20/2014  . Splenic infarct 07/17/2014  . Intractable abdominal pain 07/17/2014  . Essential hypertension 07/17/2014  . Hyperlipidemia 07/17/2014  . GAD (generalized anxiety disorder) 06/08/2014  . MDD (major depressive disorder), recurrent episode, moderate ([place])     Prior to Admission medications   Medication Sig Start Date End Date Taking? Authorizing Provider  gabapentin (NEURONTIN) 300 MG capsule Take 2 capsules (600 mg total) by mouth 3 (three) times daily. 02/07/17   Glean Hess, MD  glucose blood (ACCU-CHEK AVIVA PLUS) test strip 1 each by Other route 3 (three) times daily. Use as instructed 07/16/16   Glean Hess, MD  insulin aspart (NOVOLOG FLEXPEN) 100 UNIT/ML FlexPen Inject 10 Units into the skin 3 (three) times daily with meals. 07/16/16   Glean Hess, MD  LEVEMIR FLEXTOUCH 100 UNIT/ML Pen INJECT 15 UNITS UNDER THE SKIN EVERY MORNING AND INJECT 11 UNITS UNDER THE SKIN EVERY EVENING 04/20/17   Glean Hess, MD  lisinopril (PRINIVIL,ZESTRIL) 10 MG tablet Take 1 tablet (10 mg total) by mouth daily. 07/16/16   Glean Hess, MD  traMADol  (ULTRAM) 50 MG tablet Take 1 tablet (50 mg total) by mouth every 6 (six) hours as needed. 01/27/17 01/27/18  Lavonia Drafts, MD    Allergies  Allergen Reactions  . Doxycycline Nausea And Vomiting  . Penicillins Hives    Past Surgical History:  Procedure Laterality Date  . colonscopy  2012   polyps  . TUBAL LIGATION    . vascular stent Right 2012   Lower leg    Social History   Tobacco Use  . Smoking status: Current Every Day Smoker    Packs/day: 1.00    Types: Cigarettes  . Smokeless tobacco: Never Used  Substance Use Topics  . Alcohol use: No    Alcohol/week: 1.2 oz    Types: 2 Standard drinks or equivalent per week  . Drug use: Yes    Types: Marijuana    Comment: occassional      Medication list has been reviewed and updated.  No outpatient medications have been marked as taking for the 08/05/17 encounter (Appointment) with Glean Hess, MD.    [facility] 2/9 Scores 02/04/2017 07/16/2016  PHQ - 2 Score 0 1  PHQ- 9 Score 0 1    Physical Exam  There were no vitals taken for this visit.  Assessment and Plan:  There are no diagnoses linked to this encounter.

## 2017-08-18 ENCOUNTER — Other Ambulatory Visit: Payer: Self-pay | Admitting: Internal Medicine

## 2017-08-18 DIAGNOSIS — Z794 Long term (current) use of insulin: Principal | ICD-10-CM

## 2017-08-18 DIAGNOSIS — E11311 Type 2 diabetes mellitus with unspecified diabetic retinopathy with macular edema: Secondary | ICD-10-CM

## 2017-08-18 DIAGNOSIS — IMO0002 Reserved for concepts with insufficient information to code with codable children: Secondary | ICD-10-CM

## 2017-08-18 DIAGNOSIS — E1165 Type 2 diabetes mellitus with hyperglycemia: Principal | ICD-10-CM

## 2017-08-24 ENCOUNTER — Encounter: Payer: Self-pay | Admitting: Internal Medicine

## 2017-08-24 ENCOUNTER — Ambulatory Visit: Payer: Medicaid Other | Admitting: Internal Medicine

## 2017-08-24 VITALS — BP 128/66 | HR 72 | Ht 63.0 in | Wt 103.0 lb

## 2017-08-24 DIAGNOSIS — I10 Essential (primary) hypertension: Secondary | ICD-10-CM | POA: Diagnosis not present

## 2017-08-24 DIAGNOSIS — E1165 Type 2 diabetes mellitus with hyperglycemia: Secondary | ICD-10-CM | POA: Diagnosis not present

## 2017-08-24 DIAGNOSIS — Z23 Encounter for immunization: Secondary | ICD-10-CM

## 2017-08-24 DIAGNOSIS — R918 Other nonspecific abnormal finding of lung field: Secondary | ICD-10-CM

## 2017-08-24 DIAGNOSIS — Z1231 Encounter for screening mammogram for malignant neoplasm of breast: Secondary | ICD-10-CM | POA: Diagnosis not present

## 2017-08-24 DIAGNOSIS — F331 Major depressive disorder, recurrent, moderate: Secondary | ICD-10-CM | POA: Diagnosis not present

## 2017-08-24 DIAGNOSIS — Z794 Long term (current) use of insulin: Secondary | ICD-10-CM

## 2017-08-24 DIAGNOSIS — Z860100 Personal history of colon polyps, unspecified: Secondary | ICD-10-CM | POA: Insufficient documentation

## 2017-08-24 DIAGNOSIS — Z1159 Encounter for screening for other viral diseases: Secondary | ICD-10-CM

## 2017-08-24 DIAGNOSIS — IMO0002 Reserved for concepts with insufficient information to code with codable children: Secondary | ICD-10-CM

## 2017-08-24 DIAGNOSIS — Z7689 Persons encountering health services in other specified circumstances: Secondary | ICD-10-CM | POA: Diagnosis not present

## 2017-08-24 DIAGNOSIS — Z1239 Encounter for other screening for malignant neoplasm of breast: Secondary | ICD-10-CM

## 2017-08-24 DIAGNOSIS — E11319 Type 2 diabetes mellitus with unspecified diabetic retinopathy without macular edema: Secondary | ICD-10-CM | POA: Diagnosis not present

## 2017-08-24 DIAGNOSIS — E11311 Type 2 diabetes mellitus with unspecified diabetic retinopathy with macular edema: Secondary | ICD-10-CM

## 2017-08-24 MED ORDER — LISINOPRIL 10 MG PO TABS: 10.0000 mg | ORAL_TABLET | Freq: Every day | ORAL | 3 refills | Status: DC

## 2017-08-24 MED ORDER — GABAPENTIN 300 MG PO CAPS: 600.0000 mg | ORAL_CAPSULE | Freq: Three times a day (TID) | ORAL | 5 refills | Status: DC

## 2017-08-24 MED ORDER — INSULIN ASPART 100 UNIT/ML FLEXPEN: 10.0000 [IU] | PEN_INJECTOR | Freq: Three times a day (TID) | SUBCUTANEOUS | 11 refills | Status: DC

## 2017-08-24 MED ORDER — INSULIN DETEMIR 100 UNIT/ML FLEXPEN: PEN_INJECTOR | SUBCUTANEOUS | 5 refills | Status: DC

## 2017-08-24 NOTE — Progress Notes (Signed)
Date:  08/24/2017   Name:  Sabrina Wells   DOB:  March 09, 1960   MRN:  [id number]   Chief Complaint: Diabetes and Depression (PHQ9 - 11) Diabetes  She presents for her follow-up diabetic visit. She has type 2 diabetes mellitus. Her disease course has been stable. Pertinent negatives for hypoglycemia include no dizziness, headaches, nervousness/anxiousness or tremors. Associated symptoms include fatigue. Pertinent negatives for diabetes include no chest pain, no polydipsia and no polyuria. An ACE inhibitor/angiotensin II receptor blocker is being taken. Eye exam is not current.  Hypertension  This is a chronic problem. The problem is unchanged. The problem is controlled. Pertinent negatives include no chest pain, headaches, palpitations or shortness of breath. Past treatments include ACE inhibitors.  Depression         This is a chronic problem.  Associated symptoms include fatigue and decreased interest.  Associated symptoms include no appetite change, no headaches and no suicidal ideas.  Past treatments include SSRIs - Selective serotonin reuptake inhibitors (previously on Prozac but felt like it made her depression sx worse).  Colon polyps - she reports having  Colonoscopy about 7 years ago in [place] showing 9 polyps.  She should have had a repeat at least 5 years later.  She is agreeable to referral.  She has constipation but no bleeding or weight loss.  Pulmonary nodules - had several small nodules on CT in 2016 seen by dr. Mike Gip.  She never had a repeat in one year as recommended.  Unclear why not but likely due to insurance issues. She continues to smoke 1 ppd.  She tried chantix but it caused severe nightmares.    Review of Systems  Constitutional: Positive for fatigue. Negative for appetite change, fever and unexpected weight change.  HENT: Negative for tinnitus and trouble swallowing.   Eyes: Negative for visual disturbance.  Respiratory: Negative for cough, chest tightness and  shortness of breath.   Cardiovascular: Negative for chest pain, palpitations and leg swelling.  Gastrointestinal: Positive for constipation. Negative for abdominal pain, blood in stool, nausea and vomiting.  Endocrine: Negative for polydipsia and polyuria.  Genitourinary: Negative for dysuria and hematuria.  Musculoskeletal: Negative for arthralgias.  Skin: Negative for color change and wound.  Neurological: Negative for dizziness, tremors, numbness and headaches.  Psychiatric/Behavioral: Positive for depression. Negative for dysphoric mood, sleep disturbance and suicidal ideas. The patient is not nervous/anxious.     Patient Active Problem List   Diagnosis Date Noted  . Post-traumatic stress 11/12/2015  . PVD (peripheral vascular disease) ([place]) 12/19/2014  . Uncontrolled type 2 diabetes mellitus with retinopathy, with long-term current use of insulin ([place]) 12/19/2014  . Diabetic polyneuropathy associated with type 2 diabetes mellitus ([place]) 12/19/2014  . Gastroparesis 07/20/2014  . Multiple pulmonary nodules determined by computed tomography of lung 07/20/2014  . Splenic infarct 07/17/2014  . Intractable abdominal pain 07/17/2014  . Essential hypertension 07/17/2014  . Hyperlipidemia 07/17/2014  . GAD (generalized anxiety disorder) 06/08/2014  . MDD (major depressive disorder), recurrent episode, moderate ([place])     Prior to Admission medications   Medication Sig Start Date End Date Taking? Authorizing Provider  gabapentin (NEURONTIN) 300 MG capsule Take 2 capsules (600 mg total) by mouth 3 (three) times daily. 08/18/17   Glean Hess, MD  glucose blood (ACCU-CHEK AVIVA PLUS) test strip 1 each by Other route 3 (three) times daily. Use as instructed 07/16/16   Glean Hess, MD  insulin aspart (NOVOLOG FLEXPEN) 100 UNIT/ML FlexPen Inject 10  Units into the skin 3 (three) times daily with meals. 07/16/16   Glean Hess, MD  LEVEMIR FLEXTOUCH 100 UNIT/ML Pen INJECT 15 UNITS  UNDER THE SKIN EVERY MORNING AND INJECT 11 UNITS UNDER THE SKIN EVERY EVENING 04/20/17   Glean Hess, MD  lisinopril (PRINIVIL,ZESTRIL) 10 MG tablet Take 1 tablet (10 mg total) by mouth daily. 07/16/16   Glean Hess, MD  traMADol (ULTRAM) 50 MG tablet Take 1 tablet (50 mg total) by mouth every 6 (six) hours as needed. 01/27/17 01/27/18  Lavonia Drafts, MD    Allergies  Allergen Reactions  . Doxycycline Nausea And Vomiting  . Penicillins Hives    Past Surgical History:  Procedure Laterality Date  . colonscopy  2012   polyps  . TUBAL LIGATION    . vascular stent Right 2012   Lower leg    Social History   Tobacco Use  . Smoking status: Current Every Day Smoker    Packs/day: 1.00    Types: Cigarettes  . Smokeless tobacco: Never Used  Substance Use Topics  . Alcohol use: No    Alcohol/week: 1.2 oz    Types: 2 Standard drinks or equivalent per week  . Drug use: Yes    Types: Marijuana    Comment: occassional      Medication list has been reviewed and updated.  Current Meds  Medication Sig  . gabapentin (NEURONTIN) 300 MG capsule Take 2 capsules (600 mg total) by mouth 3 (three) times daily.  Marland Kitchen glucose blood (ACCU-CHEK AVIVA PLUS) test strip 1 each by Other route 3 (three) times daily. Use as instructed  . insulin aspart (NOVOLOG FLEXPEN) 100 UNIT/ML FlexPen Inject 10 Units into the skin 3 (three) times daily with meals.  Marland Kitchen LEVEMIR FLEXTOUCH 100 UNIT/ML Pen INJECT 15 UNITS UNDER THE SKIN EVERY MORNING AND INJECT 11 UNITS UNDER THE SKIN EVERY EVENING  . lisinopril (PRINIVIL,ZESTRIL) 10 MG tablet Take 1 tablet (10 mg total) by mouth daily.  . traMADol (ULTRAM) 50 MG tablet Take 1 tablet (50 mg total) by mouth every 6 (six) hours as needed.    PHQ 2/9 Scores 08/24/2017 02/04/2017 07/16/2016  PHQ - 2 Score 1 0 1  PHQ- 9 Score 11 0 1    Physical Exam  Constitutional: She is oriented to person, place, and time. She appears well-developed. No distress.  HENT:  Head:  Normocephalic and atraumatic.  Cardiovascular: Normal rate, regular rhythm and normal heart sounds.  Pulmonary/Chest: Effort normal. No respiratory distress. She has decreased breath sounds. She has no wheezes. She has no rhonchi.  Musculoskeletal: Normal range of motion.  Neurological: She is alert and oriented to person, place, and time.  Skin: Skin is warm and dry. No rash noted.  Psychiatric: She has a normal mood and affect. Her behavior is normal. Judgment and thought content normal. She expresses no suicidal ideation. She expresses no suicidal plans.  Nursing note and vitals reviewed.   BP 128/66   Pulse 72   Ht 5\' 3"  (1.6 m)   Wt 103 lb (46.7 kg)   SpO2 97%   BMI 18.25 kg/m   Assessment and Plan: 1. Uncontrolled type 2 diabetes mellitus with retinopathy, with long-term current use of insulin (HCC) Continue insulin therapy - Insulin Detemir (LEVEMIR FLEXTOUCH) 100 UNIT/ML Pen; 12 units in the AM and 8 units in the PM  Dispense: 6 pen; Refill: 5 - insulin aspart (NOVOLOG FLEXPEN) 100 UNIT/ML FlexPen; Inject 10 Units into the skin 3 (three)  times daily with meals.  Dispense: 15 mL; Refill: 11 - Hemoglobin A1c  2. Essential hypertension controlled - lisinopril (PRINIVIL,ZESTRIL) 10 MG tablet; Take 1 tablet (10 mg total) by mouth daily.  Dispense: 90 tablet; Refill: 3 - Comprehensive metabolic panel  3. Breast cancer screening - MM DIGITAL SCREENING BILATERAL; Future  4. MDD (major depressive disorder), recurrent episode, moderate ([place]) Pt does not wish to take any other medication at this time She is not psychotic or having suicidal thoughts or intent She will call for help or return if sx worsen  5. History of colonic polyps Overdue for repeat colonoscopy - Ambulatory referral to Gastroenterology  6. Multiple pulmonary nodules determined by computed tomography of lung Overdue for repeat CT - CT CHEST NODULE FOLLOW UP LOW DOSE W/O; Future  7. 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  Dispense: 180 capsule; Refill: 5  8. Abnormal findings on diagnostic imaging of lung As above  9. Need for hepatitis C screening test - Hepatitis C antibody  10. Need for pneumococcal vaccination - Pneumococcal polysaccharide vaccine 23-valent greater than or equal to 2yo subcutaneous/IM   Meds ordered this encounter  Medications  . Insulin Detemir (LEVEMIR FLEXTOUCH) 100 UNIT/ML Pen    Sig: 12 units in the AM and 8 units in the PM    Dispense:  6 pen    Refill:  5  . lisinopril (PRINIVIL,ZESTRIL) 10 MG tablet    Sig: Take 1 tablet (10 mg total) by mouth daily.    Dispense:  90 tablet    Refill:  3  . insulin aspart (NOVOLOG FLEXPEN) 100 UNIT/ML FlexPen    Sig: Inject 10 Units into the skin 3 (three) times daily with meals.    Dispense:  15 mL    Refill:  11  . gabapentin (NEURONTIN) 300 MG capsule    Sig: Take 2 capsules (600 mg total) by mouth 3 (three) times daily.    Dispense:  180 capsule    Refill:  5    Partially dictated using Editor, commissioning. Any errors are unintentional.  Halina Maidens, MD [facility]  08/24/2017

## 2017-08-24 NOTE — Patient Instructions (Signed)

## 2017-08-25 LAB — COMPREHENSIVE METABOLIC PANEL
ALT: 11 IU/L (ref 0–32)
AST: 18 IU/L (ref 0–40)
Albumin/Globulin Ratio: 1.4 (ref 1.2–2.2)
Albumin: 3.9 g/dL (ref 3.5–5.5)
Alkaline Phosphatase: 100 IU/L (ref 39–117)
BUN/Creatinine Ratio: 17 (ref 9–23)
BUN: 12 mg/dL (ref 6–24)
Bilirubin Total: 0.3 mg/dL (ref 0.0–1.2)
CALCIUM: 9.1 mg/dL (ref 8.7–10.2)
CO2: 23 mmol/L (ref 20–29)
CREATININE: 0.69 mg/dL (ref 0.57–1.00)
Chloride: 98 mmol/L (ref 96–106)
GFR calc Af Amer: 110 mL/min/{1.73_m2} (ref >59)
GFR, EST NON AFRICAN AMERICAN: 96 mL/min/{1.73_m2} (ref >59)
GLOBULIN, TOTAL: 2.8 g/dL (ref 1.5–4.5)
GLUCOSE: 244 mg/dL — AB (ref 65–99)
Potassium: 5.6 mmol/L — ABNORMAL HIGH (ref 3.5–5.2)
SODIUM: 135 mmol/L (ref 134–144)
Total Protein: 6.7 g/dL (ref 6.0–8.5)

## 2017-08-25 LAB — HEMOGLOBIN A1C
ESTIMATED AVERAGE GLUCOSE: 200 mg/dL
HEMOGLOBIN A1C: 8.6 % — AB (ref 4.8–5.6)

## 2017-08-25 LAB — HM DIABETES EYE EXAM

## 2017-08-25 LAB — HEPATITIS C ANTIBODY: HEP C VIRUS AB: 0.3 {s_co_ratio} (ref 0.0–0.9)

## 2017-08-26 ENCOUNTER — Other Ambulatory Visit: Payer: Self-pay

## 2017-08-26 ENCOUNTER — Telehealth: Payer: Self-pay | Admitting: Gastroenterology

## 2017-08-26 DIAGNOSIS — Z86010 Personal history of colonic polyps: Secondary | ICD-10-CM

## 2017-08-26 MED ORDER — NA SULFATE-K SULFATE-MG SULF 17.5-3.13-1.6 GM/177ML PO SOLN: 1.0000 | Freq: Once | ORAL | 0 refills | Status: AC

## 2017-08-26 NOTE — Telephone Encounter (Signed)
Patient is returning a call to schedule a colonoscopy

## 2017-08-27 ENCOUNTER — Other Ambulatory Visit: Payer: Self-pay | Admitting: Internal Medicine

## 2017-08-29 ENCOUNTER — Other Ambulatory Visit: Payer: Self-pay

## 2017-08-29 ENCOUNTER — Telehealth: Payer: Self-pay

## 2017-08-29 MED ORDER — GLUCOSE BLOOD VI STRP: ORAL_STRIP | 12 refills | Status: DC

## 2017-08-29 NOTE — Telephone Encounter (Signed)
Patient called stating she was here about a week ago for OV. Needs refill on tramadol. Could not reach patient back to verify pharmacy.   Please Advise.

## 2017-08-29 NOTE — Telephone Encounter (Signed)
I do not prescribe her Tramadol.

## 2017-08-29 NOTE — Telephone Encounter (Signed)
Patient informed of recent labs and we do not prescribe tramadol. She verbalized understanding.

## 2017-08-31 ENCOUNTER — Inpatient Hospital Stay: Admission: RE | Admit: 2017-08-31 | Payer: Medicaid Other | Source: Ambulatory Visit

## 2017-08-31 ENCOUNTER — Ambulatory Visit: Admission: RE | Admit: 2017-08-31 | Payer: Medicaid Other | Source: Ambulatory Visit

## 2017-09-06 ENCOUNTER — Telehealth: Payer: Self-pay | Admitting: Gastroenterology

## 2017-09-06 NOTE — Telephone Encounter (Signed)
Patient states she needs to reschedule procedure from 8.15.19 due to already having a CT scheduled that day. Patient requesting a call back to schedule and would like a Friday if possible.

## 2017-09-07 ENCOUNTER — Other Ambulatory Visit: Payer: Self-pay

## 2017-09-07 MED ORDER — NA SULFATE-K SULFATE-MG SULF 17.5-3.13-1.6 GM/177ML PO SOLN
1.0000 | Freq: Once | ORAL | 0 refills | Status: AC
Start: 2017-09-07 — End: 2017-09-07

## 2017-09-07 NOTE — Telephone Encounter (Signed)
Patient has been rescheduled from the 15th to the 14th with Dr. Vicente Males.  Mickel Baas in Endo has been informed to give Trish message.  Thanks [employer]

## 2017-09-14 ENCOUNTER — Encounter: Admission: RE | Payer: Self-pay | Source: Ambulatory Visit

## 2017-09-14 ENCOUNTER — Ambulatory Visit: Admission: RE | Admit: 2017-09-14 | Payer: Medicaid Other | Source: Ambulatory Visit | Admitting: Gastroenterology

## 2017-09-14 SURGERY — COLONOSCOPY WITH PROPOFOL
Anesthesia: General

## 2017-09-15 ENCOUNTER — Ambulatory Visit
Admission: RE | Admit: 2017-09-15 | Discharge: 2017-09-15 | Disposition: A | Discharge status: Home / Self Care | Payer: Medicaid Other | Source: Ambulatory Visit | Attending: Internal Medicine | Admitting: Internal Medicine

## 2017-09-15 ENCOUNTER — Ambulatory Visit: Payer: Medicaid Other

## 2017-09-15 DIAGNOSIS — R918 Other nonspecific abnormal finding of lung field: Secondary | ICD-10-CM | POA: Diagnosis not present

## 2017-09-15 DIAGNOSIS — J439 Emphysema, unspecified: Secondary | ICD-10-CM | POA: Diagnosis not present

## 2017-09-15 DIAGNOSIS — I2510 Atherosclerotic heart disease of native coronary artery without angina pectoris: Secondary | ICD-10-CM | POA: Diagnosis not present

## 2017-09-15 DIAGNOSIS — Z7689 Persons encountering health services in other specified circumstances: Secondary | ICD-10-CM | POA: Diagnosis not present

## 2017-10-10 ENCOUNTER — Telehealth: Payer: Self-pay

## 2017-10-10 DIAGNOSIS — E1165 Type 2 diabetes mellitus with hyperglycemia: Principal | ICD-10-CM

## 2017-10-10 DIAGNOSIS — Z794 Long term (current) use of insulin: Principal | ICD-10-CM

## 2017-10-10 DIAGNOSIS — IMO0002 Reserved for concepts with insufficient information to code with codable children: Secondary | ICD-10-CM

## 2017-10-10 DIAGNOSIS — E11319 Type 2 diabetes mellitus with unspecified diabetic retinopathy without macular edema: Secondary | ICD-10-CM

## 2017-10-10 MED ORDER — INSULIN PEN NEEDLE 29G X 12.7MM MISC: 3 refills | Status: DC

## 2017-10-10 NOTE — Telephone Encounter (Signed)
Mailed letter to patient after verified address to help her get off [place] [employer] for good due to DM2 and Insulin.

## 2018-01-18 DIAGNOSIS — L97521 Non-pressure chronic ulcer of other part of left foot limited to breakdown of skin: Secondary | ICD-10-CM | POA: Diagnosis not present

## 2018-01-18 DIAGNOSIS — E1042 Type 1 diabetes mellitus with diabetic polyneuropathy: Secondary | ICD-10-CM | POA: Diagnosis not present

## 2018-01-18 DIAGNOSIS — B351 Tinea unguium: Secondary | ICD-10-CM | POA: Diagnosis not present

## 2018-01-18 DIAGNOSIS — I739 Peripheral vascular disease, unspecified: Secondary | ICD-10-CM | POA: Diagnosis not present

## 2018-01-18 DIAGNOSIS — Z7689 Persons encountering health services in other specified circumstances: Secondary | ICD-10-CM | POA: Diagnosis not present

## 2018-02-02 ENCOUNTER — Other Ambulatory Visit (INDEPENDENT_AMBULATORY_CARE_PROVIDER_SITE_OTHER): Payer: Self-pay | Admitting: Vascular Surgery

## 2018-02-02 DIAGNOSIS — M79604 Pain in right leg: Secondary | ICD-10-CM

## 2018-02-02 DIAGNOSIS — M79605 Pain in left leg: Principal | ICD-10-CM

## 2018-02-03 ENCOUNTER — Ambulatory Visit (INDEPENDENT_AMBULATORY_CARE_PROVIDER_SITE_OTHER): Payer: Medicaid Other

## 2018-02-03 ENCOUNTER — Ambulatory Visit (INDEPENDENT_AMBULATORY_CARE_PROVIDER_SITE_OTHER): Payer: Medicaid Other | Admitting: Nurse Practitioner

## 2018-02-03 ENCOUNTER — Encounter (INDEPENDENT_AMBULATORY_CARE_PROVIDER_SITE_OTHER): Payer: Self-pay | Admitting: Nurse Practitioner

## 2018-02-03 VITALS — BP 149/71 | HR 73 | Resp 16

## 2018-02-03 DIAGNOSIS — I10 Essential (primary) hypertension: Secondary | ICD-10-CM | POA: Diagnosis not present

## 2018-02-03 DIAGNOSIS — M79605 Pain in left leg: Secondary | ICD-10-CM | POA: Diagnosis not present

## 2018-02-03 DIAGNOSIS — I70235 Atherosclerosis of native arteries of right leg with ulceration of other part of foot: Secondary | ICD-10-CM | POA: Diagnosis not present

## 2018-02-03 DIAGNOSIS — I70245 Atherosclerosis of native arteries of left leg with ulceration of other part of foot: Secondary | ICD-10-CM

## 2018-02-03 DIAGNOSIS — E785 Hyperlipidemia, unspecified: Secondary | ICD-10-CM

## 2018-02-03 DIAGNOSIS — M79604 Pain in right leg: Secondary | ICD-10-CM | POA: Diagnosis not present

## 2018-02-03 DIAGNOSIS — F17210 Nicotine dependence, cigarettes, uncomplicated: Secondary | ICD-10-CM

## 2018-02-03 DIAGNOSIS — L97519 Non-pressure chronic ulcer of other part of right foot with unspecified severity: Secondary | ICD-10-CM

## 2018-02-03 DIAGNOSIS — L97529 Non-pressure chronic ulcer of other part of left foot with unspecified severity: Secondary | ICD-10-CM

## 2018-02-03 DIAGNOSIS — Z7689 Persons encountering health services in other specified circumstances: Secondary | ICD-10-CM | POA: Diagnosis not present

## 2018-02-06 ENCOUNTER — Encounter (INDEPENDENT_AMBULATORY_CARE_PROVIDER_SITE_OTHER): Payer: Self-pay

## 2018-02-06 ENCOUNTER — Telehealth (INDEPENDENT_AMBULATORY_CARE_PROVIDER_SITE_OTHER): Payer: Self-pay

## 2018-02-06 NOTE — Telephone Encounter (Signed)
Spoke with the patient and gave her the arrival time and pre-procedure instructions regarding her angio with Dr. Lucky Cowboy on 02/09/2018. The information was given early due to the patient rides on [employer] transportation and needed to call 3 days prior to the day.

## 2018-02-08 ENCOUNTER — Encounter (INDEPENDENT_AMBULATORY_CARE_PROVIDER_SITE_OTHER): Payer: Self-pay | Admitting: Nurse Practitioner

## 2018-02-08 DIAGNOSIS — I70249 Atherosclerosis of native arteries of left leg with ulceration of unspecified site: Secondary | ICD-10-CM

## 2018-02-08 DIAGNOSIS — I70239 Atherosclerosis of native arteries of right leg with ulceration of unspecified site: Secondary | ICD-10-CM | POA: Insufficient documentation

## 2018-02-08 MED ORDER — CLINDAMYCIN PHOSPHATE 300 MG/50ML IV SOLN
300.0000 mg | Freq: Once | INTRAVENOUS | Status: AC
  Administered 2018-02-09: 300 mg via INTRAVENOUS

## 2018-02-08 NOTE — Progress Notes (Signed)
Subjective:    Patient ID: Sabrina Wells, female    DOB: [birth year], 58 y.o.   MRN: [id number] Chief Complaint  Patient presents with  . New Patient (Initial Visit)    HPI  Sabrina Wells is a 58 y.o. female that is referred by Dr. Cleda Mccreedy in podiatry due to nonpalpable pulses as well as lower extremity wounds.  Patient reports having previous vascular interventions done however she is not 100% sure where but she believes it was in her right lower extremity.  The patient is a current smoker.  The patient currently has a wound located on her right second toe that is wet and showing early gangrenous color changes on the posterior aspect.  There is another wound located on her left fourth interspace area with no drainage however covered by eschar.  The patient also has an extensive history of neuropathy.  The patient endorses lifestyle limiting claudication with worsening neuropathy.  She denies any fever, chills, nausea, vomiting or diarrhea.  The patient is also a type I diabetic.  She denies any chest pain or shortness of breath.  Today she underwent bilateral ABIs which revealed an ABI of 0.60 on her right with a TBI of 0.17.  She has a left ABI of 0.52 with a TBI of 0.15.  She has monophasic waveforms in her bilateral tibial arteries.  Her bilateral great toe waveforms are flat  Past Medical History:  Diagnosis Date  . Breast mass 2017   Bil marked with BB's  . Depressed   . Diabetes mellitus without complication ([place])   . Hypertension   . Neuropathy   . Vascular disease     Past Surgical History:  Procedure Laterality Date  . colonscopy  2012   polyps  . TUBAL LIGATION    . vascular stent Right 2012   Lower leg    Social History   Socioeconomic History  . Marital status: Divorced    Spouse name: Not on file  . Number of children: Not on file  . Years of education: Not on file  . Highest education level: Not on file  Occupational History  .  Occupation: Disabled    Comment: SSD since 2015  Social Needs  . Financial resource strain: Not on file  . Food insecurity:    Worry: Not on file    Inability: Not on file  . Transportation needs:    Medical: Not on file    Non-medical: Not on file  Tobacco Use  . Smoking status: Current Every Day Smoker    Packs/day: 1.00    Types: Cigarettes  . Smokeless tobacco: Never Used  Substance and Sexual Activity  . Alcohol use: No    Alcohol/week: 2.0 standard drinks    Types: 2 Standard drinks or equivalent per week  . Drug use: Yes    Types: Marijuana    Comment: occassional   . Sexual activity: Never  Lifestyle  . Physical activity:    Days per week: Not on file    Minutes per session: Not on file  . Stress: Not on file  Relationships  . Social connections:    Talks on phone: Not on file    Gets together: Not on file    Attends religious service: Not on file    Active member of club or organization: Not on file    Attends meetings of clubs or organizations: Not on file    Relationship status: Not on file  . Intimate  partner violence:    Fear of current or ex partner: Not on file    Emotionally abused: Not on file    Physically abused: Not on file    Forced sexual activity: Not on file  Other Topics Concern  . Not on file  Social History Narrative  . Not on file    Family History  Problem Relation Age of Onset  . Diabetes Mother   . Hypertension Mother   . Throat cancer Mother   . Thyroid disease Daughter   . Breast cancer Neg Hx     Allergies  Allergen Reactions  . Doxycycline Nausea And Vomiting  . Penicillins Hives     Review of Systems   Review of Systems: Negative Unless Checked Constitutional: [] Weight loss  [] Fever  [] Chills Cardiac: [] Chest pain   []  Atrial Fibrillation  [] Palpitations   [] Shortness of breath when laying flat   [] Shortness of breath with exertion. [] Shortness of breath at rest Vascular:  [x] Pain in legs with walking   [] Pain in  legs with standing [] Pain in legs when laying flat   [x] Claudication    [x] Pain in feet when laying flat    [] History of DVT   [] Phlebitis   [] Swelling in legs   [] Varicose veins   [x] Non-healing ulcers Pulmonary:   [] Uses home oxygen   [] Productive cough   [] Hemoptysis   [] Wheeze  [] COPD   [] Asthma Neurologic:  [] Dizziness   [] Seizures  [] Blackouts [] History of stroke   [] History of TIA  [] Aphasia   [] Temporary Blindness   [] Weakness or numbness in arm   [x] Weakness or numbness in leg Musculoskeletal:   [] Joint swelling   [] Joint pain   [] Low back pain  []  History of Knee Replacement [] Arthritis [] back Surgeries  []  Spinal Stenosis    Hematologic:  [] Easy bruising  [] Easy bleeding   [] Hypercoagulable state   [] Anemic Gastrointestinal:  [] Diarrhea   [] Vomiting  [] Gastroesophageal reflux/heartburn   [] Difficulty swallowing. [] Abdominal pain Genitourinary:  [] Chronic kidney disease   [] Difficult urination  [] Anuric   [] Blood in urine [] Frequent urination  [] Burning with urination   [] Hematuria Skin:  [] Rashes   [] Ulcers [x] Wounds Psychological:  [x] History of anxiety   [x]  History of major depression  []  Memory Difficulties     Objective:   Physical Exam  BP (!) 149/71 (BP Location: Left Arm, Patient Position: Sitting)   Pulse 73   Resp 16   Gen: WD/WN, NAD Head: [place]/AT, No temporalis wasting.  Ear/Nose/Throat: Hearing grossly intact, nares w/o erythema or drainage Eyes: PER, EOMI, sclera nonicteric.  Neck: Supple, no masses.  No JVD.  Pulmonary:  Good air movement, no use of accessory muscles.  Cardiac: RRR Vascular: Wet ulcer on second toe of right lower extremity with early gangrenous color changes, eschar covered ulcer on posterior aspect of foot between fourth and fifth toes.  Vessel Right Left  Radial Palpable Palpable  Dorsalis Pedis Not Palpable Not Palpable  Posterior Tibial Not Palpable Not Palpable   Gastrointestinal: soft, non-distended. No guarding/no peritoneal signs.    Musculoskeletal: M/S 5/5 throughout.  No deformity or atrophy.  Neurologic: Pain and light touch intact in extremities.  Symmetrical.  Speech is fluent. Motor exam as listed above. Psychiatric: Judgment intact, Mood & affect appropriate for pt's clinical situation. Dermatologic: No Venous rashes.   No changes consistent with cellulitis. Lymph : No Cervical lymphadenopathy, no lichenification or skin changes of chronic lymphedema.      Assessment & Plan:   1. 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  She has a left ABI of 0.52 with a TBI of 0.15.  She has monophasic waveforms in her bilateral tibial arteries.  Her bilateral great toe waveforms are flat   Recommend:  The patient has evidence of severe atherosclerotic changes of both lower extremities associated with ulceration and tissue loss of the foot.  This represents a limb threatening ischemia and places the patient at the risk for limb loss.  Patient should undergo angiography of the lower extremities with the hope for intervention for limb salvage.  The risks and benefits as well as the alternative therapies was discussed in detail with the patient.  All questions were answered.  Patient agrees to proceed with angiography.  The patient will follow up with me in the office after the procedure.    2. Hyperlipidemia, unspecified hyperlipidemia type Continue statin as ordered and reviewed, no changes at this time   3. Essential hypertension Continue antihypertensive medications as already ordered, these medications have been reviewed and there are no changes at this time.    Current Outpatient Medications on File Prior to Visit  Medication Sig Dispense Refill  . gabapentin (NEURONTIN) 300 MG capsule Take 2 capsules (600 mg total) by mouth 3 (three) times daily.  180 capsule 5  . glucose blood (ACCU-CHEK AVIVA PLUS) test strip USE 1 EACH BY OTHER ROUTE 3 (THREE) TIMES DAILY. USE AS INSTRUCTED 100 each 12  . insulin aspart (NOVOLOG FLEXPEN) 100 UNIT/ML FlexPen Inject 10 Units into the skin 3 (three) times daily with meals. 15 mL 11  . Insulin Detemir (LEVEMIR FLEXTOUCH) 100 UNIT/ML Pen 12 units in the AM and 8 units in the PM 6 pen 5  . Insulin Pen Needle (BD ULTRA-FINE PEN NEEDLES) 29G X 12.7MM MISC Use  Compatible with insurance and pen (U/F BD PEN NEEDLES)  Use to inject Insulin 3 times daily: see insulin directions DX: E11.09 Dm2. 100 each 3  . lisinopril (PRINIVIL,ZESTRIL) 10 MG tablet Take 1 tablet (10 mg total) by mouth daily. 90 tablet 3   No current facility-administered medications on file prior to visit.     There are no Patient Instructions on file for this visit. No follow-ups on file.   Kris Hartmann, NP  This note was completed with Sales executive.  Any errors are purely unintentional.

## 2018-02-09 ENCOUNTER — Encounter: Payer: Self-pay | Admitting: *Deleted

## 2018-02-09 ENCOUNTER — Other Ambulatory Visit (INDEPENDENT_AMBULATORY_CARE_PROVIDER_SITE_OTHER): Payer: Self-pay | Admitting: Vascular Surgery

## 2018-02-09 ENCOUNTER — Other Ambulatory Visit: Payer: Self-pay

## 2018-02-09 ENCOUNTER — Ambulatory Visit
Admission: RE | Admit: 2018-02-09 | Discharge: 2018-02-09 | Disposition: A | Discharge status: Home / Self Care | Payer: Medicaid Other | Attending: Vascular Surgery | Admitting: Vascular Surgery

## 2018-02-09 ENCOUNTER — Encounter
Admission: RE | Disposition: A | Discharge status: Home / Self Care | Payer: Self-pay | Source: Home / Self Care | Attending: Vascular Surgery

## 2018-02-09 DIAGNOSIS — Z880 Allergy status to penicillin: Secondary | ICD-10-CM | POA: Diagnosis not present

## 2018-02-09 DIAGNOSIS — F17210 Nicotine dependence, cigarettes, uncomplicated: Secondary | ICD-10-CM | POA: Diagnosis not present

## 2018-02-09 DIAGNOSIS — Z95820 Peripheral vascular angioplasty status with implants and grafts: Secondary | ICD-10-CM | POA: Insufficient documentation

## 2018-02-09 DIAGNOSIS — L97511 Non-pressure chronic ulcer of other part of right foot limited to breakdown of skin: Secondary | ICD-10-CM | POA: Diagnosis not present

## 2018-02-09 DIAGNOSIS — E1040 Type 1 diabetes mellitus with diabetic neuropathy, unspecified: Secondary | ICD-10-CM | POA: Diagnosis not present

## 2018-02-09 DIAGNOSIS — L97521 Non-pressure chronic ulcer of other part of left foot limited to breakdown of skin: Secondary | ICD-10-CM | POA: Diagnosis not present

## 2018-02-09 DIAGNOSIS — Z8249 Family history of ischemic heart disease and other diseases of the circulatory system: Secondary | ICD-10-CM | POA: Diagnosis not present

## 2018-02-09 DIAGNOSIS — Z881 Allergy status to other antibiotic agents status: Secondary | ICD-10-CM | POA: Diagnosis not present

## 2018-02-09 DIAGNOSIS — Z9851 Tubal ligation status: Secondary | ICD-10-CM | POA: Insufficient documentation

## 2018-02-09 DIAGNOSIS — Z833 Family history of diabetes mellitus: Secondary | ICD-10-CM | POA: Diagnosis not present

## 2018-02-09 DIAGNOSIS — I10 Essential (primary) hypertension: Secondary | ICD-10-CM | POA: Insufficient documentation

## 2018-02-09 DIAGNOSIS — I70223 Atherosclerosis of native arteries of extremities with rest pain, bilateral legs: Secondary | ICD-10-CM | POA: Insufficient documentation

## 2018-02-09 DIAGNOSIS — E10621 Type 1 diabetes mellitus with foot ulcer: Secondary | ICD-10-CM | POA: Diagnosis not present

## 2018-02-09 DIAGNOSIS — I70299 Other atherosclerosis of native arteries of extremities, unspecified extremity: Secondary | ICD-10-CM

## 2018-02-09 DIAGNOSIS — Z79899 Other long term (current) drug therapy: Secondary | ICD-10-CM | POA: Insufficient documentation

## 2018-02-09 DIAGNOSIS — E785 Hyperlipidemia, unspecified: Secondary | ICD-10-CM | POA: Insufficient documentation

## 2018-02-09 DIAGNOSIS — L97529 Non-pressure chronic ulcer of other part of left foot with unspecified severity: Secondary | ICD-10-CM

## 2018-02-09 DIAGNOSIS — Z7689 Persons encountering health services in other specified circumstances: Secondary | ICD-10-CM | POA: Diagnosis not present

## 2018-02-09 DIAGNOSIS — L97909 Non-pressure chronic ulcer of unspecified part of unspecified lower leg with unspecified severity: Secondary | ICD-10-CM

## 2018-02-09 DIAGNOSIS — I70245 Atherosclerosis of native arteries of left leg with ulceration of other part of foot: Secondary | ICD-10-CM

## 2018-02-09 DIAGNOSIS — Z794 Long term (current) use of insulin: Secondary | ICD-10-CM | POA: Insufficient documentation

## 2018-02-09 HISTORY — PX: LOWER EXTREMITY ANGIOGRAPHY: CATH118251

## 2018-02-09 LAB — GLUCOSE, CAPILLARY
Glucose-Capillary: 274 mg/dL — ABNORMAL HIGH (ref 70–99)
Glucose-Capillary: 258 mg/dL — ABNORMAL HIGH (ref 70–99)
Glucose-Capillary: 181 mg/dL — ABNORMAL HIGH (ref 70–99)

## 2018-02-09 SURGERY — LOWER EXTREMITY ANGIOGRAPHY
Anesthesia: Moderate Sedation | Site: Leg Lower | Laterality: Right

## 2018-02-09 MED ORDER — ASPIRIN EC 81 MG PO TBEC
81.0000 mg | DELAYED_RELEASE_TABLET | Freq: Every day | ORAL | Status: DC
  Administered 2018-02-09: 81 mg via ORAL

## 2018-02-09 MED ORDER — CLOPIDOGREL BISULFATE 75 MG PO TABS: 75.0000 mg | ORAL_TABLET | Freq: Every day | ORAL | 11 refills | Status: DC

## 2018-02-09 MED ORDER — SODIUM CHLORIDE 0.9 % IV SOLN: 250.0000 mL | INTRAVENOUS | Status: DC | PRN

## 2018-02-09 MED ORDER — IOPAMIDOL (ISOVUE-300) INJECTION 61%
INTRAVENOUS | Status: DC | PRN
  Administered 2018-02-09: 75 mL via INTRA_ARTERIAL

## 2018-02-09 MED ORDER — SODIUM CHLORIDE 0.9 % IV SOLN: INTRAVENOUS | Status: DC

## 2018-02-09 MED ORDER — HEPARIN SODIUM (PORCINE) 1000 UNIT/ML IJ SOLN
INTRAMUSCULAR | Status: AC
  Filled 2018-02-09: qty 1

## 2018-02-09 MED ORDER — ONDANSETRON HCL 4 MG/2ML IJ SOLN: 4.0000 mg | Freq: Four times a day (QID) | INTRAMUSCULAR | Status: DC | PRN

## 2018-02-09 MED ORDER — NITROGLYCERIN 1 MG/10 ML FOR IR/CATH LAB
INTRA_ARTERIAL | Status: DC | PRN
  Administered 2018-02-09: 500 ug

## 2018-02-09 MED ORDER — HYDRALAZINE HCL 20 MG/ML IJ SOLN: 5.0000 mg | INTRAMUSCULAR | Status: DC | PRN

## 2018-02-09 MED ORDER — ONDANSETRON HCL 4 MG/2ML IJ SOLN
4.0000 mg | Freq: Four times a day (QID) | INTRAMUSCULAR | Status: DC | PRN
  Administered 2018-02-09: 4 mg via INTRAVENOUS

## 2018-02-09 MED ORDER — MIDAZOLAM HCL 5 MG/5ML IJ SOLN
INTRAMUSCULAR | Status: AC
  Filled 2018-02-09: qty 5

## 2018-02-09 MED ORDER — ONDANSETRON HCL 4 MG/2ML IJ SOLN
INTRAMUSCULAR | Status: AC
  Administered 2018-02-09: 4 mg via INTRAVENOUS
  Filled 2018-02-09: qty 2

## 2018-02-09 MED ORDER — SODIUM CHLORIDE 0.9% FLUSH: 3.0000 mL | Freq: Two times a day (BID) | INTRAVENOUS | Status: DC

## 2018-02-09 MED ORDER — MIDAZOLAM HCL 2 MG/2ML IJ SOLN
INTRAMUSCULAR | Status: DC | PRN
  Administered 2018-02-09: 2 mg via INTRAVENOUS
  Administered 2018-02-09 (×2): 1 mg via INTRAVENOUS

## 2018-02-09 MED ORDER — SODIUM CHLORIDE (PF) 0.9 % IJ SOLN
INTRAMUSCULAR | Status: AC
  Filled 2018-02-09: qty 50

## 2018-02-09 MED ORDER — HYDROMORPHONE HCL 1 MG/ML IJ SOLN
1.0000 mg | Freq: Once | INTRAMUSCULAR | Status: AC | PRN
  Administered 2018-02-09: 1 mg via INTRAVENOUS

## 2018-02-09 MED ORDER — ACETAMINOPHEN 325 MG PO TABS: 650.0000 mg | ORAL_TABLET | ORAL | Status: DC | PRN

## 2018-02-09 MED ORDER — LABETALOL HCL 5 MG/ML IV SOLN: 10.0000 mg | INTRAVENOUS | Status: DC | PRN

## 2018-02-09 MED ORDER — HEPARIN SODIUM (PORCINE) 1000 UNIT/ML IJ SOLN
INTRAMUSCULAR | Status: DC | PRN
  Administered 2018-02-09: 4000 [IU] via INTRAVENOUS

## 2018-02-09 MED ORDER — ONDANSETRON 4 MG PO TBDP: 4.0000 mg | ORAL_TABLET | Freq: Once | ORAL | Status: DC

## 2018-02-09 MED ORDER — CLINDAMYCIN PHOSPHATE 300 MG/50ML IV SOLN
INTRAVENOUS | Status: AC
  Administered 2018-02-09: 300 mg via INTRAVENOUS
  Filled 2018-02-09: qty 50

## 2018-02-09 MED ORDER — NITROGLYCERIN 5 MG/ML IV SOLN
INTRAVENOUS | Status: AC
  Filled 2018-02-09: qty 10

## 2018-02-09 MED ORDER — ONDANSETRON 4 MG PO TBDP
ORAL_TABLET | ORAL | Status: AC
  Administered 2018-02-09: 4 mg
  Filled 2018-02-09: qty 1

## 2018-02-09 MED ORDER — HYDROMORPHONE HCL 1 MG/ML IJ SOLN
INTRAMUSCULAR | Status: AC
  Filled 2018-02-09: qty 1

## 2018-02-09 MED ORDER — SODIUM CHLORIDE 0.9 % IV SOLN
INTRAVENOUS | Status: DC
  Administered 2018-02-09: 10:00:00 via INTRAVENOUS

## 2018-02-09 MED ORDER — ATORVASTATIN CALCIUM 10 MG PO TABS: 10.0000 mg | ORAL_TABLET | Freq: Every day | ORAL | 11 refills | Status: DC

## 2018-02-09 MED ORDER — ASPIRIN EC 81 MG PO TBEC
DELAYED_RELEASE_TABLET | ORAL | Status: AC
  Filled 2018-02-09: qty 1

## 2018-02-09 MED ORDER — SODIUM CHLORIDE 0.9% FLUSH: 3.0000 mL | INTRAVENOUS | Status: DC | PRN

## 2018-02-09 MED ORDER — LIDOCAINE-EPINEPHRINE (PF) 1 %-1:200000 IJ SOLN
INTRAMUSCULAR | Status: AC
  Filled 2018-02-09: qty 10

## 2018-02-09 MED ORDER — ATORVASTATIN CALCIUM 10 MG PO TABS: 10.0000 mg | ORAL_TABLET | Freq: Every day | ORAL | Status: DC

## 2018-02-09 MED ORDER — ASPIRIN EC 81 MG PO TBEC: 81.0000 mg | DELAYED_RELEASE_TABLET | Freq: Every day | ORAL | 2 refills | Status: AC

## 2018-02-09 MED ORDER — CLOPIDOGREL BISULFATE 75 MG PO TABS
ORAL_TABLET | ORAL | Status: AC
  Filled 2018-02-09: qty 1

## 2018-02-09 MED ORDER — FENTANYL CITRATE (PF) 100 MCG/2ML IJ SOLN
INTRAMUSCULAR | Status: AC
  Filled 2018-02-09: qty 2

## 2018-02-09 MED ORDER — FENTANYL CITRATE (PF) 100 MCG/2ML IJ SOLN
INTRAMUSCULAR | Status: DC | PRN
  Administered 2018-02-09: 50 ug via INTRAVENOUS
  Administered 2018-02-09 (×2): 25 ug via INTRAVENOUS

## 2018-02-09 MED ORDER — CLOPIDOGREL BISULFATE 75 MG PO TABS: 75.0000 mg | ORAL_TABLET | Freq: Every day | ORAL | Status: DC

## 2018-02-09 MED ORDER — HEPARIN (PORCINE) IN NACL 1000-0.9 UT/500ML-% IV SOLN
INTRAVENOUS | Status: AC
  Filled 2018-02-09: qty 1000

## 2018-02-09 SURGICAL SUPPLY — 28 items
BALLN LUTONIX 018 4X220X130 (BALLOONS) ×3
BALLN LUTONIX 018 5X220X130 (BALLOONS) ×3
BALLN ULTRVRSE 2X150X150 (BALLOONS) ×2
BALLN ULTRVRSE 2X150X150 OTW (BALLOONS) ×1
BALLN ULTRVRSE 3X100X150 (BALLOONS) ×3
BALLN ULTRVRSE 4X80X150 (BALLOONS) ×2
BALLN ULTRVRSE 4X80X150 OTW (BALLOONS) ×1
BALLOON LUTONIX 018 4X220X130 (BALLOONS) IMPLANT
BALLOON LUTONIX 018 5X220X130 (BALLOONS) IMPLANT
BALLOON ULTRVRSE 2X150X150 OTW (BALLOONS) IMPLANT
BALLOON ULTRVRSE 3X100X150 (BALLOONS) IMPLANT
BALLOON ULTRVRSE 4X80X150 OTW (BALLOONS) IMPLANT
CATH BEACON 5 .035 65 KMP TIP (CATHETERS) ×2 IMPLANT
CATH BEACON 5 .038 100 VERT TP (CATHETERS) ×2 IMPLANT
CATH PIG 70CM (CATHETERS) ×2 IMPLANT
COVER PROBE U/S 5X48 (MISCELLANEOUS) ×2 IMPLANT
DEVICE PRESTO INFLATION (MISCELLANEOUS) ×2 IMPLANT
DEVICE STARCLOSE SE CLOSURE (Vascular Products) ×2 IMPLANT
GLIDEWIRE ADV .035X180CM (WIRE) ×2 IMPLANT
GUIDEWIRE PFTE-COATED .018X300 (WIRE) ×2 IMPLANT
PACK ANGIOGRAPHY (CUSTOM PROCEDURE TRAY) ×3 IMPLANT
SHEATH ANL2 6FRX45 HC (SHEATH) ×2 IMPLANT
SHEATH BRITE TIP 5FRX11 (SHEATH) ×2 IMPLANT
STENT VIABAHN 6X100X120 (Permanent Stent) ×2 IMPLANT
STENT VIABAHN 6X7.5X120 (Permanent Stent) ×2 IMPLANT
SYR MEDRAD MARK V 150ML (SYRINGE) ×2 IMPLANT
TUBING CONTRAST HIGH PRESS 72 (TUBING) ×2 IMPLANT
WIRE J 3MM .035X145CM (WIRE) ×2 IMPLANT

## 2018-02-09 NOTE — H&P (Signed)
[facility] History & Physical Update  The patient was interviewed and re-examined.  The patient's previous History and Physical has been reviewed and is unchanged.  The patient has severe atherosclerotic occlusive disease to both lower extremities.  She says her left leg is more painful and would like to do this leg first.  That is certainly fine.  She is already scheduled to have her other leg done in the near future.  Risks and benefits were discussed and she is agreeable to proceed.  Leotis Pain, MD  02/09/2018, 10:09 AM

## 2018-02-09 NOTE — OR Nursing (Signed)
Pt reported sudden onset of nausea after started eating. Vomited, zofran iv given nausea relieved after vomiting.

## 2018-02-09 NOTE — Discharge Instructions (Signed)
Femoral Site Care °Refer to this sheet in the next few weeks. These instructions provide you with information about caring for yourself after your procedure. Your health care provider may also give you more specific instructions. Your treatment has been planned according to current medical practices, but problems sometimes occur. Call your health care provider if you have any problems or questions after your procedure. °What can I expect after the procedure? °After your procedure, it is typical to have the following: °· Bruising at the site that usually fades within 1-2 weeks. °· Blood collecting in the tissue (hematoma) that may be painful to the touch. It should usually decrease in size and tenderness within 1-2 weeks. ° °Follow these instructions at home: ° °· Take medicines only as directed by your health care provider.  If you take Metformin, hold for 48hours after your procedure. ° °The x-ray dye causes you to pass a considerate amount of urine.  For this reason, you will be asked to drink plenty of liquids after the procedure to prevent dehydration.  You may resume you regular diet.  Avoid caffeine products.   °· You may shower 24 hours after procedure. Leave the bandage on your access site for.  You may wash around your dressing but do not rub the site, this may cause bleeding.  Pat the area dry with a clean towel. After 48hours remove bandage and leave open to air. °· Do not take baths, swim, or use a hot tub for 7 days. °· Check your insertion site every day for redness, swelling, or drainage. °· If you lose feeling or develop tingling or pain in your leg or foot after the procedure, please walk around first.  If the discomfort does not improve , contact your physician and proceed to the nearest emergency room.  Loss of feeling in your leg might mean that a blockage has formed in the artery and this can be appropriately treated.  Limit your activity for the next two days after your procedure.  Avoid  stooping, bending, heavy lifting or exertion as this may put pressure on the insertion site.  Resume normal activities in 48 hours.   °check the insertion site occasionally.  If any oozing occurs or there is apparent swelling, firm pressure over the site will prevent a bruise from forming.  You can not hurt anything by pressing directly on the site.  The pressure stops the bleeding by allowing a small clot to form.  If the bleeding continues after the pressure has been applied for more than 15 minutes, call 911 or go to the nearest emergency room.   ° °· Apply pressure to access site if you have to laugh, cough, or sneeze. °· Do not apply powder or lotion to the site. °· Limit use of stairs to twice a day for the first 2-3 days or as directed by your health care provider. °· Do not squat for the first 2-3 days or as directed by your health care provider. °· Do not lift over 10 lb (4.5 kg) for 5 days after your procedure or as directed by your health care provider. °· Ask your health care provider when it is okay to: °? Return to work or school. °? Resume usual physical activities or sports. °? Resume sexual activity. °· Do not drive home if you are discharged the same day as the procedure. Have someone else drive you. °· You may drive 48 hours after the procedure unless otherwise instructed by your health care   provider.  Do not operate machinery or power tools for 24 hours after the procedure or as directed by your health care provider.  If your procedure was done as an outpatient procedure, which means that you went home the same day as your procedure, a responsible adult should be with you for the first 24 hours after you arrive home.  Keep all follow-up visits as directed by your health care provider. This is important. Contact a health care provider if:  You have a fever.  You have chills.  You have increased bleeding from the site. Hold pressure on the site. Get help right away if:  You have  unusual pain at the site.  You have redness, warmth, or swelling at the site.  You have drainage (other than a small amount of blood on the dressing) from the site.  The site is bleeding, and the bleeding does not stop after 30 minutes of holding steady pressure on the site.  Your leg or foot becomes pale, cool, tingly, or numb. This information is not intended to replace advice given to you by your health care provider. Make sure you discuss any questions you have with your health care provider. Document Released: 09/21/2013 Document Revised: 06/26/2015 Document Reviewed: 08/07/2013 Elsevier Interactive Patient Education  2018 [employer]Femoral Site Care Refer to this sheet in the next few weeks. These instructions provide you with information about caring for yourself after your procedure. Your health care provider may also give you more specific instructions. Your treatment has been planned according to current medical practices, but problems sometimes occur. Call your health care provider if you have any problems or questions after your procedure. What can I expect after the procedure? After your procedure, it is typical to have the following:  Bruising at the site that usually fades within 1-2 weeks.  Blood collecting in the tissue (hematoma) that may be painful to the touch. It should usually decrease in size and tenderness within 1-2 weeks.  Follow these instructions at home:   Take medicines only as directed by your health care provider.  If you take Metformin, hold for 48hours after your procedure.  The x-ray dye causes you to pass a considerate amount of urine.  For this reason, you will be asked to drink plenty of liquids after the procedure to prevent dehydration.  You may resume you regular diet.  Avoid caffeine products.    You may shower 24 hours after procedure. Leave the bandage on your access site for.  You may wash around your dressing but do not rub the site, this  may cause bleeding.  Pat the area dry with a clean towel. After 48hours remove bandage and leave open to air.  Do not take baths, swim, or use a hot tub for 7 days.  Check your insertion site every day for redness, swelling, or drainage.  If you lose feeling or develop tingling or pain in your leg or foot after the procedure, please walk around first.  If the discomfort does not improve , contact your physician and proceed to the nearest emergency room.  Loss of feeling in your leg might mean that a blockage has formed in the artery and this can be appropriately treated.  Limit your activity for the next two days after your procedure.  Avoid stooping, bending, heavy lifting or exertion as this may put pressure on the insertion site.  Resume normal activities in 48 hours.   check the insertion site occasionally.  If  any oozing occurs or there is apparent swelling, firm pressure over the site will prevent a bruise from forming.  You can not hurt anything by pressing directly on the site.  The pressure stops the bleeding by allowing a small clot to form.  If the bleeding continues after the pressure has been applied for more than 15 minutes, call 911 or go to the nearest emergency room.     Apply pressure to access site if you have to laugh, cough, or sneeze.  Do not apply powder or lotion to the site.  Limit use of stairs to twice a day for the first 2-3 days or as directed by your health care provider.  Do not squat for the first 2-3 days or as directed by your health care provider.  Do not lift over 10 lb (4.5 kg) for 5 days after your procedure or as directed by your health care provider.  Ask your health care provider when it is okay to: ? Return to work or school. ? Resume usual physical activities or sports. ? Resume sexual activity.  Do not drive home if you are discharged the same day as the procedure. Have someone else drive you.  You may drive 48 hours after the procedure unless  otherwise instructed by your health care provider.  Do not operate machinery or power tools for 24 hours after the procedure or as directed by your health care provider.  If your procedure was done as an outpatient procedure, which means that you went home the same day as your procedure, a responsible adult should be with you for the first 24 hours after you arrive home.  Keep all follow-up visits as directed by your health care provider. This is important. Contact a health care provider if:  You have a fever.  You have chills.  You have increased bleeding from the site. Hold pressure on the site. Get help right away if:  You have unusual pain at the site.  You have redness, warmth, or swelling at the site.  You have drainage (other than a small amount of blood on the dressing) from the site.  The site is bleeding, and the bleeding does not stop after 30 minutes of holding steady pressure on the site.  Your leg or foot becomes pale, cool, tingly, or numb. This information is not intended to replace advice given to you by your health care provider. Make sure you discuss any questions you have with your health care provider. Document Released: 09/21/2013 Document Revised: 06/26/2015 Document Reviewed: 08/07/2013 Elsevier Interactive Patient Education  2018 [employer]Angiogram, Care After This sheet gives you information about how to care for yourself after your procedure. Your health care provider may also give you more specific instructions. If you have problems or questions, contact your health care provider. What can I expect after the procedure? After the procedure, it is common to have bruising and tenderness at the catheter insertion area. Follow these instructions at home: Insertion site care  Follow instructions from your health care provider about how to take care of your insertion site. Make sure you: ? Wash your hands with soap and water before you change your bandage  (dressing). If soap and water are not available, use hand sanitizer. ? Change your dressing as told by your health care provider. ? Leave stitches (sutures), skin glue, or adhesive strips in place. These skin closures may need to stay in place for 2 weeks or longer. If adhesive strip edges start  to loosen and curl up, you may trim the loose edges. Do not remove adhesive strips completely unless your health care provider tells you to do that.  Do not take baths, swim, or use a hot tub until your health care provider approves.  You may shower 24-48 hours after the procedure or as told by your health care provider. ? Gently wash the site with plain soap and water. ? Pat the area dry with a clean towel. ? Do not rub the site. This may cause bleeding.  Do not apply powder or lotion to the site. Keep the site clean and dry.  Check your insertion site every day for signs of infection. Check for: ? Redness, swelling, or pain. ? Fluid or blood. ? Warmth. ? Pus or a bad smell. Activity  Rest as told by your health care provider, usually for 1-2 days.  Do not lift anything that is heavier than 10 lbs. (4.5 kg) or as told by your health care provider.  Do not drive for 24 hours if you were given a medicine to help you relax (sedative).  Do not drive or use heavy machinery while taking prescription pain medicine. General instructions   Return to your normal activities as told by your health care provider, usually in about a week. Ask your health care provider what activities are safe for you.  If the catheter site starts bleeding, lie flat and put pressure on the site. If the bleeding does not stop, get help right away. This is a medical emergency.  Drink enough fluid to keep your urine clear or pale yellow. This helps flush the contrast dye from your body.  Take over-the-counter and prescription medicines only as told by your health care provider.  Keep all follow-up visits as told by your  health care provider. This is important. Contact a health care provider if:  You have a fever or chills.  You have redness, swelling, or pain around your insertion site.  You have fluid or blood coming from your insertion site.  The insertion site feels warm to the touch.  You have pus or a bad smell coming from your insertion site.  You have bruising around the insertion site.  You notice blood collecting in the tissue around the catheter site (hematoma). The hematoma may be painful to the touch. Get help right away if:  You have severe pain at the catheter insertion area.  The catheter insertion area swells very fast.  The catheter insertion area is bleeding, and the bleeding does not stop when you hold steady pressure on the area.  The area near or just beyond the catheter insertion site becomes pale, cool, tingly, or numb. These symptoms may represent a serious problem that is an emergency. Do not wait to see if the symptoms will go away. Get medical help right away. Call your local emergency services (911 in the [place] Do not drive yourself to the hospital. Summary  After the procedure, it is common to have bruising and tenderness at the catheter insertion area.  After the procedure, it is important to rest and drink plenty of fluids.  Do not take baths, swim, or use a hot tub until your health care provider says it is okay to do so. You may shower 24-48 hours after the procedure or as told by your health care provider.  If the catheter site starts bleeding, lie flat and put pressure on the site. If the bleeding does not stop, get help right  away. This is a medical emergency. This information is not intended to replace advice given to you by your health care provider. Make sure you discuss any questions you have with your health care provider. Document Released: 08/06/2004 Document Revised: 12/24/2015 Document Reviewed: 12/24/2015 Elsevier Interactive Patient Education   2019 [employer].

## 2018-02-09 NOTE — Op Note (Signed)
[facility]  Percutaneous Study/Intervention Procedural Note   Date of Surgery: 02/09/2018  Surgeon(s):,    Assistants:none  Pre-operative Diagnosis: PAD with ulceration and rest pain bilateral lower extremities  Post-operative diagnosis:  Same  Procedure(s) Performed:             1.  Ultrasound guidance for vascular access right femoral artery             2.  Catheter placement into left common femoral artery from right femoral approach             3.  Aortogram and selective left lower extremity angiogram             4.  Percutaneous transluminal angioplasty of left anterior tibial artery with 3 mm diameter balloon proximally and 2 mm diameter angioplasty balloon distally             5.   Percutaneous transluminal angioplasty of left SFA and popliteal arteries with 4 mm diameter Lutonix drug-coated angioplasty balloon distally and 5 mm diameter Lutonix drug-coated angioplasty balloon proximally  6.  Viabahn stent placement for residual stenosis and thrombus after angioplasty in the proximal to mid SFA using a 6 mm diameter by 10 cm length and a 6 mm diameter by 7.5 cm length stent             7.  StarClose closure device right femoral artery  EBL: 5 cc  Contrast: 75 cc  Fluoro Time: 5.6 minutes  Moderate Conscious Sedation Time: approximately 45 minutes using 4 mg of Versed and 100 Mcg of Fentanyl              Indications:  Patient is a 58 y.o.female with rest pain and ulcerative changes to both feet. The patient has noninvasive study showing reduced perfusion bilaterally. The patient is brought in for angiography for further evaluation and potential treatment.  Due to the limb threatening nature of the situation, angiogram was performed for attempted limb salvage. The patient is aware that if the procedure fails, amputation would be expected.  The patient also understands that even with successful revascularization, amputation may still be required due  to the severity of the situation.  Risks and benefits are discussed and informed consent is obtained.   Procedure:  The patient was identified and appropriate procedural time out was performed.  The patient was then placed supine on the table and prepped and draped in the usual sterile fashion. Moderate conscious sedation was administered during a face to face encounter with the patient throughout the procedure with my supervision of the RN administering medicines and monitoring the patient's vital signs, pulse oximetry, telemetry and mental status throughout from the start of the procedure until the patient was taken to the recovery room. Ultrasound was used to evaluate the right common femoral artery.  It was patent .  A digital ultrasound image was acquired.  A Seldinger needle was used to access the right common femoral artery under direct ultrasound guidance and a permanent image was performed.  A 0.035 J wire was advanced without resistance and a 5Fr sheath was placed.  Pigtail catheter was placed into the aorta and an AP aortogram was performed. This demonstrated mild stenosis in the right renal artery and no stenosis in the left renal artery and normal aorta and iliac segments without significant stenosis. I then crossed the aortic bifurcation and advanced to the left femoral head. Selective left lower extremity angiogram was then performed. This  demonstrated near flush occlusion of the left SFA with reconstitution in the mid SFA.  There was disease in the distal SFA and the above-knee popliteal artery with areas of greater than 70% stenosis.  The anterior tibial artery was occluded proximally and filled through collaterals and then was the only runoff distally although there was another occlusion just above the ankle. It was felt that it was in the patient's best interest to proceed with intervention after these images to avoid a second procedure and a larger amount of contrast and fluoroscopy based off  of the findings from the initial angiogram. The patient was systemically heparinized and a 6 [place] Ansell sheath was then placed over the [employer] wire. I then used a Kumpe catheter and the advantage wire to easily navigate through the SFA occlusion and confirm intraluminal flow in the below-knee popliteal artery.  I then crossed the anterior tibial artery occlusions with a 0.018 wire and parked the wire in the foot.  I then proceeded with treatment.  The distal anterior tibial artery was treated with a 2 mm diameter by 15 cm length angioplasty balloon inflated to 8 atm for 1 minute.  The proximal anterior tibial artery was treated with a 3 mm diameter by 10 cm length angioplasty balloon inflated to 10 atm for a 4 mm diameter by 22 cm length Lutonix drug-coated angioplasty balloon was inflated from the above-knee popliteal artery up to the mid SFA.  This was taken to 10 atm for 1 minute.  The proximal and mid SFA were then treated with a 5 mm diameter by 22 cm length Lutonix drug-coated angioplasty balloon inflated to 12 atm for 1 minute.  The anterior tibial artery had less than 20% residual stenosis in the proximal segment and about 30 to 35% stenosis in the distal segment although some degree of spasm may have been seen as well.  The mid distal SFA had less than 20% stenosis, but the proximal SFA had greater than 70% residual stenosis and there was significant chronic thrombus present as well.  I elected to cover this with covered stents.  A 6 mm diameter by 10 cm length covered stent was placed down to the mid SFA and a 6 mm diameter by 7.5 cm length stent overlapped with this by about 2 cm and was placed at the origin of the SFA.  This was postdilated with a 4 mm balloon with excellent angiographic completion result in about 15 to 20% residual stenosis with no residual thrombosis. I elected to terminate the procedure. The sheath was removed and StarClose closure device was deployed in the right femoral  artery with excellent hemostatic result. The patient was taken to the recovery room in stable condition having tolerated the procedure well.  Findings:               Aortogram:  This demonstrated mild stenosis in the right renal artery and no stenosis in the left renal artery and normal aorta and iliac segments without significant stenosis             Left lower Extremity:  Near flush occlusion of the left SFA with reconstitution in the mid SFA.  There was disease in the distal SFA and the above-knee popliteal artery with areas of greater than 70% stenosis.  The anterior tibial artery was occluded proximally and filled through collaterals and then was the only runoff distally although there was another occlusion just above the ankle.   Disposition: Patient was taken to  the recovery room in stable condition having tolerated the procedure well.  Complications: None  Leotis Pain 02/09/2018 12:01 PM   This note was created with Dragon Medical transcription system. Any errors in dictation are purely unintentional.

## 2018-02-09 NOTE — Progress Notes (Signed)
Dr. Lucky Cowboy at bedside, speaking with pt. And her boyfriend re: procedure/results. Both verbalize understanding.

## 2018-02-09 NOTE — OR Nursing (Addendum)
Vomited again. Reports feeling shaky fsbs 182. Zofran ODT given. Discussed with pt probablitiy of the fact that she hasnt smoked in 6 hours that is probably making her jittery.

## 2018-02-13 ENCOUNTER — Ambulatory Visit: Payer: Self-pay | Admitting: Internal Medicine

## 2018-02-14 ENCOUNTER — Telehealth (INDEPENDENT_AMBULATORY_CARE_PROVIDER_SITE_OTHER): Payer: Self-pay

## 2018-02-14 NOTE — Telephone Encounter (Signed)
Patient called complaining of pain , bruising with soreness. Patient is requesting pain medication. I did ask if she has tried taking Ibuprofen and elevation of her left leg and the patient states that she has taken almost a whole bottle of Ibuprofen so she wants something for pain. The patient also requested that her right leg angio be moved out to 1/27 as well.

## 2018-02-14 NOTE — Telephone Encounter (Signed)
Spoke with the patient and let her know that her prescription has been called into her pharmacy at [facility] for Tramadol 50 mg #30 Q hours PRN with one refill.

## 2018-02-15 ENCOUNTER — Encounter (INDEPENDENT_AMBULATORY_CARE_PROVIDER_SITE_OTHER): Payer: Self-pay

## 2018-02-24 ENCOUNTER — Encounter: Payer: Self-pay | Admitting: Internal Medicine

## 2018-02-24 ENCOUNTER — Ambulatory Visit (INDEPENDENT_AMBULATORY_CARE_PROVIDER_SITE_OTHER): Payer: Medicaid Other | Admitting: Internal Medicine

## 2018-02-24 VITALS — BP 128/82 | HR 87 | Ht 63.0 in | Wt 102.8 lb

## 2018-02-24 DIAGNOSIS — I10 Essential (primary) hypertension: Secondary | ICD-10-CM | POA: Diagnosis not present

## 2018-02-24 DIAGNOSIS — Z794 Long term (current) use of insulin: Secondary | ICD-10-CM

## 2018-02-24 DIAGNOSIS — I70245 Atherosclerosis of native arteries of left leg with ulceration of other part of foot: Secondary | ICD-10-CM | POA: Diagnosis not present

## 2018-02-24 DIAGNOSIS — E11311 Type 2 diabetes mellitus with unspecified diabetic retinopathy with macular edema: Secondary | ICD-10-CM | POA: Diagnosis not present

## 2018-02-24 DIAGNOSIS — E11319 Type 2 diabetes mellitus with unspecified diabetic retinopathy without macular edema: Secondary | ICD-10-CM

## 2018-02-24 DIAGNOSIS — E1165 Type 2 diabetes mellitus with hyperglycemia: Secondary | ICD-10-CM

## 2018-02-24 DIAGNOSIS — IMO0002 Reserved for concepts with insufficient information to code with codable children: Secondary | ICD-10-CM

## 2018-02-24 DIAGNOSIS — I70235 Atherosclerosis of native arteries of right leg with ulceration of other part of foot: Secondary | ICD-10-CM

## 2018-02-24 DIAGNOSIS — Z7689 Persons encountering health services in other specified circumstances: Secondary | ICD-10-CM | POA: Diagnosis not present

## 2018-02-24 MED ORDER — CEPHALEXIN 500 MG PO CAPS: 500.0000 mg | ORAL_CAPSULE | Freq: Four times a day (QID) | ORAL | 0 refills | Status: DC

## 2018-02-24 MED ORDER — TRAMADOL HCL 50 MG PO TABS: 50.0000 mg | ORAL_TABLET | Freq: Four times a day (QID) | ORAL | 0 refills | Status: DC | PRN

## 2018-02-24 MED ORDER — INSULIN DETEMIR 100 UNIT/ML FLEXPEN: PEN_INJECTOR | SUBCUTANEOUS | 5 refills | Status: DC

## 2018-02-24 MED ORDER — LISINOPRIL 10 MG PO TABS: 10.0000 mg | ORAL_TABLET | Freq: Every day | ORAL | 3 refills | Status: DC

## 2018-02-24 MED ORDER — INSULIN PEN NEEDLE 31G X 5 MM MISC: 1.0000 | Freq: Four times a day (QID) | 12 refills | Status: DC

## 2018-02-24 MED ORDER — GABAPENTIN 300 MG PO CAPS: 600.0000 mg | ORAL_CAPSULE | Freq: Three times a day (TID) | ORAL | 12 refills | Status: DC

## 2018-02-24 MED ORDER — CLOPIDOGREL BISULFATE 75 MG PO TABS: 75.0000 mg | ORAL_TABLET | Freq: Every day | ORAL | 11 refills | Status: DC

## 2018-02-24 NOTE — Progress Notes (Signed)
Date:  02/24/2018   Name:  Sabrina Wells   DOB:  Dec 01, 1960   MRN:  [id number]   Chief Complaint: Diabetes (6 month follow up.) and Hypertension  Diabetes  She presents for her follow-up diabetic visit. She has type 2 diabetes mellitus. Her disease course has been fluctuating. Pertinent negatives for hypoglycemia include no dizziness, headaches or tremors. Associated symptoms include foot paresthesias. Pertinent negatives for diabetes include no chest pain, no fatigue, no polydipsia, no polyuria and no weakness. Diabetic complications include PVD. (With severe sx in both legs.  Have surgery from [facility] on Monday.) Current diabetic treatment includes insulin injections and oral agent (monotherapy) (basal - bolus plus metformin). She is following a generally unhealthy diet. An ACE inhibitor/angiotensin II receptor blocker is being taken. She sees a podiatrist.Eye exam is current.  Hypertension  This is a chronic problem. The problem is controlled. Pertinent negatives include no chest pain, headaches, palpitations or shortness of breath. Hypertensive end-organ damage includes PVD.  PAD - non healing ulceration of both legs. She had stent done on left 2 weeks ago.  Had angiography 2 weeks ago and scheduled for surgery with stent placement on the right on Monday.  Taking tramadol for pain from Dr. Lucky Cowboy.  She continues on atorvastatin and plavix.  The ulcers on the left are not healing and she has not had any antibiotics.  Review of Systems  Constitutional: Negative for appetite change, chills, fatigue, fever and unexpected weight change.  HENT: Negative for tinnitus and trouble swallowing.   Eyes: Negative for visual disturbance.  Respiratory: Negative for cough, chest tightness, shortness of breath and wheezing.   Cardiovascular: Positive for leg swelling (on left). Negative for chest pain and palpitations.  Gastrointestinal: Negative for abdominal pain.  Endocrine: Negative for polydipsia  and polyuria.  Genitourinary: Negative for dysuria and hematuria.  Musculoskeletal: Positive for arthralgias and gait problem.  Skin: Positive for color change and wound.  Neurological: Positive for numbness. Negative for dizziness, tremors, weakness and headaches.  Psychiatric/Behavioral: Negative for dysphoric mood.    Patient Active Problem List   Diagnosis Date Noted  . 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 History of colonic polyps 08/24/2017  . Post-traumatic stress 11/12/2015  . PVD (peripheral vascular disease) ([place]) 12/19/2014  . Uncontrolled type 2 diabetes mellitus with retinopathy, with long-term current use of insulin ([place]) 12/19/2014  . Diabetic polyneuropathy associated with type 2 diabetes mellitus ([place]) 12/19/2014  . Gastroparesis 07/20/2014  . Multiple pulmonary nodules determined by computed tomography of lung 07/20/2014  . Splenic infarct 07/17/2014  . Intractable abdominal pain 07/17/2014  . Essential hypertension 07/17/2014  . Hyperlipidemia 07/17/2014  . GAD (generalized anxiety disorder) 06/08/2014  . MDD (major depressive disorder), recurrent episode, moderate (HCC)     Allergies  Allergen Reactions  . Doxycycline Nausea And Vomiting  . Penicillins Hives  . Percocet [Oxycodone-Acetaminophen] Nausea And Vomiting  . Vicodin [Hydrocodone-Acetaminophen] Nausea And Vomiting    Past Surgical History:  Procedure Laterality Date  . colonscopy  2012   polyps  . LOWER EXTREMITY ANGIOGRAPHY Right 02/09/2018   Procedure: LOWER EXTREMITY ANGIOGRAPHY;  Surgeon: Algernon Huxley, MD;  Location: [facility];  Service: Cardiovascular;  Laterality: Right;  . TUBAL LIGATION    . vascular stent Right 2012   Lower leg    Social History   Tobacco Use  . Smoking status: Current Every Day Smoker    Packs/day: 0.50  Types: Cigarettes  . Smokeless tobacco: Never Used  Substance Use Topics  .  Alcohol use: No    Alcohol/week: 2.0 standard drinks    Types: 2 Standard drinks or equivalent per week  . Drug use: Yes    Types: Marijuana    Comment: occassional      Medication list has been reviewed and updated.  Current Meds  Medication Sig  . aspirin EC 81 MG tablet Take 1 tablet (81 mg total) by mouth daily.  Marland Kitchen atorvastatin (LIPITOR) 10 MG tablet Take 1 tablet (10 mg total) by mouth daily.  . clopidogrel (PLAVIX) 75 MG tablet Take 1 tablet (75 mg total) by mouth daily.  Marland Kitchen gabapentin (NEURONTIN) 300 MG capsule Take 2 capsules (600 mg total) by mouth 3 (three) times daily.  Marland Kitchen glucose blood (ACCU-CHEK AVIVA PLUS) test strip USE 1 EACH BY OTHER ROUTE 3 (THREE) TIMES DAILY. USE AS INSTRUCTED  . insulin aspart (NOVOLOG FLEXPEN) 100 UNIT/ML FlexPen Inject 10 Units into the skin 3 (three) times daily with meals.  . Insulin Detemir (LEVEMIR FLEXTOUCH) 100 UNIT/ML Pen 12 units in the AM and 8 units in the PM  . Insulin Pen Needle (BD ULTRA-FINE PEN NEEDLES) 29G X 12.7MM MISC Use  Compatible with insurance and pen (U/F BD PEN NEEDLES)  Use to inject Insulin 3 times daily: see insulin directions DX: E11.09 Dm2.  . lisinopril (PRINIVIL,ZESTRIL) 10 MG tablet Take 1 tablet (10 mg total) by mouth daily.    PHQ 2/9 Scores 02/24/2018 08/24/2017 02/04/2017 07/16/2016  PHQ - 2 Score 2 1 0 1  PHQ- 9 Score 5 11 0 1    Physical Exam Vitals signs and nursing note reviewed.  Constitutional:      General: She is not in acute distress.    Appearance: She is well-developed.  HENT:     Head: Normocephalic and atraumatic.  Eyes:     Pupils: Pupils are equal, round, and reactive to light.  Neck:     Musculoskeletal: Normal range of motion and neck supple.  Cardiovascular:     Rate and Rhythm: Normal rate and regular rhythm.     Pulses:          Dorsalis pedis pulses are 0 on the right side and 0 on the left side.       Posterior tibial pulses are 0 on the right side and 0 on the left side.    Pulmonary:     Effort: Pulmonary effort is normal. No respiratory distress.     Breath sounds: Normal breath sounds.  Musculoskeletal:     Right foot: Decreased range of motion.     Left foot: Decreased range of motion.  Feet:     Right foot:     Skin integrity: Dry skin present. No erythema or warmth.     Toenail Condition: Right toenails are normal.     Left foot:     Skin integrity: Ulcer, skin breakdown, erythema and warmth present.     Toenail Condition: Left toenails are normal.  Lymphadenopathy:     Cervical: No cervical adenopathy.  Skin:    General: Skin is warm and dry.     Findings: No rash.  Neurological:     Mental Status: She is alert and oriented to person, place, and time.  Psychiatric:        Behavior: Behavior normal.        Thought Content: Thought content normal.     BP 128/82  Pulse 87   Ht 5\' 3"  (1.6 m)   Wt 102 lb 12.8 oz (46.6 kg)   SpO2 97%   BMI 18.21 kg/m   Assessment and Plan: 1. Uncontrolled type 2 diabetes mellitus with retinopathy, with long-term current use of insulin (HCC) - Insulin Detemir (LEVEMIR FLEXTOUCH) 100 UNIT/ML Pen; 12 units in the AM and 8 units in the PM  Dispense: 6 pen; Refill: 5 - Insulin Pen Needle 31G X 5 MM MISC; 1 each by Does not apply route 4 (four) times daily.  Dispense: 100 each; Refill: 12 - Hemoglobin A1c - Comprehensive metabolic panel  2. 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  Dispense: 28 tablet; Refill: 0 - cephALEXin (KEFLEX) 500 MG capsule; Take 1 capsule (500 mg total) by mouth 4 (four) times daily.  Dispense: 40 capsule; Refill: 0 - clopidogrel (PLAVIX) 75 MG tablet; Take 1 tablet (75 mg total) by mouth daily.  Dispense: 30 tablet; Refill: 11  3.  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  Dispense: 180 capsule; Refill: 12  4. Essential hypertension controlled - lisinopril (PRINIVIL,ZESTRIL) 10 MG tablet; Take 1 tablet (10 mg total) by mouth daily.  Dispense: 90 tablet; Refill: 3   Partially dictated using Editor, commissioning. Any errors are unintentional.  Halina Maidens, MD [facility]  02/24/2018

## 2018-02-25 LAB — COMPREHENSIVE METABOLIC PANEL
ALT: 16 IU/L (ref 0–32)
AST: 21 IU/L (ref 0–40)
Albumin/Globulin Ratio: 1.2 (ref 1.2–2.2)
Albumin: 3.9 g/dL (ref 3.8–4.9)
Alkaline Phosphatase: 112 IU/L (ref 39–117)
BUN/Creatinine Ratio: 23 (ref 9–23)
BUN: 14 mg/dL (ref 6–24)
CO2: 24 mmol/L (ref 20–29)
Calcium: 9.1 mg/dL (ref 8.7–10.2)
Chloride: 98 mmol/L (ref 96–106)
Creatinine, Ser: 0.62 mg/dL (ref 0.57–1.00)
GFR calc Af Amer: 114 mL/min/{1.73_m2} (ref >59)
GFR calc non Af Amer: 99 mL/min/{1.73_m2} (ref >59)
Globulin, Total: 3.2 g/dL (ref 1.5–4.5)
Glucose: 146 mg/dL — ABNORMAL HIGH (ref 65–99)
Potassium: 6.5 mmol/L — ABNORMAL HIGH (ref 3.5–5.2)
Sodium: 136 mmol/L (ref 134–144)
Total Protein: 7.1 g/dL (ref 6.0–8.5)

## 2018-02-25 LAB — HEMOGLOBIN A1C
Est. average glucose Bld gHb Est-mCnc: 197 mg/dL
Hgb A1c MFr Bld: 8.5 % — ABNORMAL HIGH (ref 4.8–5.6)

## 2018-02-27 ENCOUNTER — Ambulatory Visit
Admission: RE | Admit: 2018-02-27 | Discharge: 2018-02-27 | Disposition: A | Discharge status: Home / Self Care | Payer: Medicaid Other | Attending: Vascular Surgery | Admitting: Vascular Surgery

## 2018-02-27 ENCOUNTER — Other Ambulatory Visit (INDEPENDENT_AMBULATORY_CARE_PROVIDER_SITE_OTHER): Payer: Self-pay | Admitting: Nurse Practitioner

## 2018-02-27 ENCOUNTER — Encounter
Admission: RE | Disposition: A | Discharge status: Home / Self Care | Payer: Self-pay | Source: Home / Self Care | Attending: Vascular Surgery

## 2018-02-27 ENCOUNTER — Encounter: Payer: Self-pay | Admitting: *Deleted

## 2018-02-27 DIAGNOSIS — I7092 Chronic total occlusion of artery of the extremities: Secondary | ICD-10-CM

## 2018-02-27 DIAGNOSIS — I70245 Atherosclerosis of native arteries of left leg with ulceration of other part of foot: Secondary | ICD-10-CM

## 2018-02-27 DIAGNOSIS — Z79899 Other long term (current) drug therapy: Secondary | ICD-10-CM | POA: Diagnosis not present

## 2018-02-27 DIAGNOSIS — I10 Essential (primary) hypertension: Secondary | ICD-10-CM | POA: Insufficient documentation

## 2018-02-27 DIAGNOSIS — E785 Hyperlipidemia, unspecified: Secondary | ICD-10-CM | POA: Diagnosis not present

## 2018-02-27 DIAGNOSIS — Z7689 Persons encountering health services in other specified circumstances: Secondary | ICD-10-CM | POA: Diagnosis not present

## 2018-02-27 DIAGNOSIS — I70221 Atherosclerosis of native arteries of extremities with rest pain, right leg: Secondary | ICD-10-CM | POA: Insufficient documentation

## 2018-02-27 DIAGNOSIS — I70299 Other atherosclerosis of native arteries of extremities, unspecified extremity: Secondary | ICD-10-CM

## 2018-02-27 DIAGNOSIS — I70235 Atherosclerosis of native arteries of right leg with ulceration of other part of foot: Secondary | ICD-10-CM

## 2018-02-27 DIAGNOSIS — L97909 Non-pressure chronic ulcer of unspecified part of unspecified lower leg with unspecified severity: Secondary | ICD-10-CM

## 2018-02-27 DIAGNOSIS — Z95820 Peripheral vascular angioplasty status with implants and grafts: Secondary | ICD-10-CM

## 2018-02-27 DIAGNOSIS — T82856A Stenosis of peripheral vascular stent, initial encounter: Secondary | ICD-10-CM

## 2018-02-27 HISTORY — PX: LOWER EXTREMITY ANGIOGRAPHY: CATH118251

## 2018-02-27 LAB — POTASSIUM (ARMC VASCULAR LAB ONLY): Potassium (ARMC vascular lab): 4.6 (ref 3.5–5.1)

## 2018-02-27 LAB — GLUCOSE, CAPILLARY: Glucose-Capillary: 225 mg/dL — ABNORMAL HIGH (ref 70–99)

## 2018-02-27 SURGERY — LOWER EXTREMITY ANGIOGRAPHY
Anesthesia: Moderate Sedation | Site: Leg Lower | Laterality: Left

## 2018-02-27 MED ORDER — HYDROMORPHONE HCL 1 MG/ML IJ SOLN
0.5000 mg | Freq: Once | INTRAMUSCULAR | Status: AC | PRN
  Administered 2018-02-27: 0.5 mg via INTRAVENOUS

## 2018-02-27 MED ORDER — SODIUM CHLORIDE 0.9 % IV SOLN: 250.0000 mL | INTRAVENOUS | Status: DC | PRN

## 2018-02-27 MED ORDER — ACETAMINOPHEN 325 MG PO TABS: 650.0000 mg | ORAL_TABLET | ORAL | Status: DC | PRN

## 2018-02-27 MED ORDER — HYDRALAZINE HCL 20 MG/ML IJ SOLN: 5.0000 mg | INTRAMUSCULAR | Status: DC | PRN

## 2018-02-27 MED ORDER — HYDROMORPHONE HCL 1 MG/ML IJ SOLN: 1.0000 mg | Freq: Once | INTRAMUSCULAR | Status: DC | PRN

## 2018-02-27 MED ORDER — FENTANYL CITRATE (PF) 100 MCG/2ML IJ SOLN
INTRAMUSCULAR | Status: AC
  Filled 2018-02-27: qty 2

## 2018-02-27 MED ORDER — CLINDAMYCIN PHOSPHATE 300 MG/50ML IV SOLN
INTRAVENOUS | Status: AC
  Filled 2018-02-27: qty 50

## 2018-02-27 MED ORDER — SODIUM CHLORIDE 0.9 % IV SOLN: INTRAVENOUS | Status: DC

## 2018-02-27 MED ORDER — SODIUM CHLORIDE 0.9% FLUSH: 3.0000 mL | INTRAVENOUS | Status: DC | PRN

## 2018-02-27 MED ORDER — HEPARIN (PORCINE) IN NACL 1000-0.9 UT/500ML-% IV SOLN
INTRAVENOUS | Status: AC
  Filled 2018-02-27: qty 1000

## 2018-02-27 MED ORDER — DIPHENHYDRAMINE HCL 50 MG/ML IJ SOLN: 50.0000 mg | Freq: Once | INTRAMUSCULAR | Status: DC | PRN

## 2018-02-27 MED ORDER — LIDOCAINE-EPINEPHRINE (PF) 1 %-1:200000 IJ SOLN
INTRAMUSCULAR | Status: AC
  Filled 2018-02-27: qty 30

## 2018-02-27 MED ORDER — FENTANYL CITRATE (PF) 100 MCG/2ML IJ SOLN
INTRAMUSCULAR | Status: DC | PRN
  Administered 2018-02-27: 50 ug via INTRAVENOUS
  Administered 2018-02-27: 25 ug
  Administered 2018-02-27 (×3): 25 ug via INTRAVENOUS
  Administered 2018-02-27: 25 ug
  Administered 2018-02-27: 25 ug via INTRAVENOUS

## 2018-02-27 MED ORDER — SODIUM CHLORIDE 0.9% FLUSH: 3.0000 mL | Freq: Two times a day (BID) | INTRAVENOUS | Status: DC

## 2018-02-27 MED ORDER — MIDAZOLAM HCL 5 MG/5ML IJ SOLN
INTRAMUSCULAR | Status: AC
  Filled 2018-02-27: qty 5

## 2018-02-27 MED ORDER — LABETALOL HCL 5 MG/ML IV SOLN: 10.0000 mg | INTRAVENOUS | Status: DC | PRN

## 2018-02-27 MED ORDER — HEPARIN SODIUM (PORCINE) 1000 UNIT/ML IJ SOLN
INTRAMUSCULAR | Status: DC | PRN
  Administered 2018-02-27: 4000 [IU] via INTRAVENOUS

## 2018-02-27 MED ORDER — CLINDAMYCIN PHOSPHATE 300 MG/50ML IV SOLN
300.0000 mg | Freq: Once | INTRAVENOUS | Status: AC
  Administered 2018-02-27: 300 mg via INTRAVENOUS

## 2018-02-27 MED ORDER — ONDANSETRON HCL 4 MG/2ML IJ SOLN: 4.0000 mg | Freq: Four times a day (QID) | INTRAMUSCULAR | Status: DC | PRN

## 2018-02-27 MED ORDER — LABETALOL HCL 5 MG/ML IV SOLN
INTRAVENOUS | Status: DC | PRN
  Administered 2018-02-27: 10 mg via INTRAVENOUS

## 2018-02-27 MED ORDER — HEPARIN SODIUM (PORCINE) 1000 UNIT/ML IJ SOLN
INTRAMUSCULAR | Status: AC
  Filled 2018-02-27: qty 1

## 2018-02-27 MED ORDER — METHYLPREDNISOLONE SODIUM SUCC 125 MG IJ SOLR: 125.0000 mg | Freq: Once | INTRAMUSCULAR | Status: DC | PRN

## 2018-02-27 MED ORDER — LIDOCAINE HCL (PF) 1 % IJ SOLN
INTRAMUSCULAR | Status: DC | PRN
  Administered 2018-02-27: 10 mL

## 2018-02-27 MED ORDER — MIDAZOLAM HCL 2 MG/2ML IJ SOLN
INTRAMUSCULAR | Status: DC | PRN
  Administered 2018-02-27 (×3): 1 mg via INTRAVENOUS
  Administered 2018-02-27: 2 mg via INTRAVENOUS

## 2018-02-27 MED ORDER — IOPAMIDOL (ISOVUE-300) INJECTION 61%
INTRAVENOUS | Status: DC | PRN
  Administered 2018-02-27: 85 mL via INTRA_ARTERIAL

## 2018-02-27 MED ORDER — TRAMADOL HCL 50 MG PO TABS
ORAL_TABLET | ORAL | Status: AC
  Filled 2018-02-27: qty 1

## 2018-02-27 MED ORDER — TRAMADOL HCL 50 MG PO TABS
50.0000 mg | ORAL_TABLET | Freq: Once | ORAL | Status: AC
  Administered 2018-02-27: 50 mg via ORAL

## 2018-02-27 MED ORDER — TRAMADOL HCL 50 MG PO TABS: 50.0000 mg | ORAL_TABLET | Freq: Four times a day (QID) | ORAL | 0 refills | Status: AC | PRN

## 2018-02-27 MED ORDER — MIDAZOLAM HCL 2 MG/ML PO SYRP: 8.0000 mg | ORAL_SOLUTION | Freq: Once | ORAL | Status: DC | PRN

## 2018-02-27 MED ORDER — LABETALOL HCL 5 MG/ML IV SOLN
INTRAVENOUS | Status: AC
  Filled 2018-02-27: qty 4

## 2018-02-27 MED ORDER — HYDROMORPHONE HCL 1 MG/ML IJ SOLN
INTRAMUSCULAR | Status: AC
  Administered 2018-02-27: 0.5 mg via INTRAVENOUS
  Filled 2018-02-27: qty 0.5

## 2018-02-27 SURGICAL SUPPLY — 27 items
BALLN DORADO 5X200X135 (BALLOONS) ×3
BALLN LUTONIX 018 4X80X130 (BALLOONS) ×3
BALLN LUTONIX 4X220X130 (BALLOONS) ×3
BALLN LUTONIX 5X220X130 (BALLOONS) ×3
BALLN ULTRVRSE 2.5X300X150 (BALLOONS) ×3
BALLOON DORADO 5X200X135 (BALLOONS) ×1 IMPLANT
BALLOON LUTONIX 018 4X80X130 (BALLOONS) ×1 IMPLANT
BALLOON LUTONIX 4X220X130 (BALLOONS) ×1 IMPLANT
BALLOON LUTONIX 5X220X130 (BALLOONS) ×1 IMPLANT
BALLOON ULTRVRSE 2.5X300X150 (BALLOONS) ×1 IMPLANT
CATH BEACON 5 .038 100 VERT TP (CATHETERS) ×3 IMPLANT
CATH CXI SUPP ANG 4FR 135 (CATHETERS) ×1 IMPLANT
CATH CXI SUPP ANG 4FR 135CM (CATHETERS) ×3
CATH PIG 70CM (CATHETERS) ×3 IMPLANT
CATH SEEKER .035X135CM (CATHETERS) ×3 IMPLANT
DEVICE PRESTO INFLATION (MISCELLANEOUS) ×3 IMPLANT
DEVICE STARCLOSE SE CLOSURE (Vascular Products) ×3 IMPLANT
DEVICE TORQUE .025-.038 (MISCELLANEOUS) ×3 IMPLANT
GLIDEWIRE ADV .035X260CM (WIRE) ×3 IMPLANT
PACK ANGIOGRAPHY (CUSTOM PROCEDURE TRAY) ×3 IMPLANT
SHEATH ANL2 6FRX45 HC (SHEATH) ×3 IMPLANT
SHEATH BRITE TIP 5FRX11 (SHEATH) ×3 IMPLANT
STENT VIABAHN 6X150X120 (Permanent Stent) ×3 IMPLANT
STENT VIABAHN 6X250X120 (Permanent Stent) ×3 IMPLANT
TUBING CONTRAST HIGH PRESS 72 (TUBING) ×3 IMPLANT
WIRE G V18X300CM (WIRE) ×3 IMPLANT
WIRE J 3MM .035X145CM (WIRE) ×3 IMPLANT

## 2018-02-27 NOTE — Progress Notes (Signed)
Patient informed of labs. Patient currently at hospital in recovery from surgery.

## 2018-02-27 NOTE — Op Note (Addendum)
[facility]  Percutaneous Study/Intervention Procedural Note   Date of Surgery: 02/27/2018  Surgeon(s):DEW,JASON    Assistants:none  Pre-operative Diagnosis: PAD with rest pain right lower extremity, status post intervention to the left lower extremity for rest pain and ulceration earlier this month  Post-operative diagnosis:  Same  Procedure(s) Performed:             1.  Ultrasound guidance for vascular access left femoral artery             2.  Catheter placement into right common femoral artery from left femoral approach             3.  Aortogram and selective right lower extremity angiogram occluding selective injections of both the anterior tibial and posterior tibial arteries             4.  Percutaneous transluminal angioplasty of right anterior tibial artery with 2.5 mm diameter by 30 cm length angioplasty balloon             5.   Percutaneous transluminal angioplasty of the right posterior tibial artery and tibioperoneal trunk with 2.5 mm diameter by 30 cm length angioplasty balloon  6.  Percutaneous transluminal angioplasty of the entire right SFA and popliteal arteries with 4 mm diameter Lutonix drug-coated angioplasty balloons distally and 5 mm diameter Lutonix drug-coated angioplasty balloons proximally  7.  Viabahn stent placement x2 to the right SFA and popliteal arteries for multiple areas of greater than 70% residual stenosis after angioplasty using a 6 mm diameter by 25 cm length stent and a 6 mm diameter by 15 cm length stent             8.  StarClose closure device left femoral artery  EBL: 5 cc  Contrast: 85 cc  Fluoro Time: 18.4 minutes  Moderate Conscious Sedation Time: approximately 60 minutes using 5 mg of Versed and 200 Mcg of Fentanyl              Indications:  Patient is a 58 y.o.female with rest pain of the right foot.  She has already undergone revascularization for limb salvage on the left leg for ulceration and rest pain. The  patient has noninvasive study showing reduced perfusion prior to either intervention. The patient is brought in for angiography for further evaluation and potential treatment.  Due to the limb threatening nature of the situation, angiogram was performed for attempted limb salvage. The patient is aware that if the procedure fails, amputation would be expected.  The patient also understands that even with successful revascularization, amputation may still be required due to the severity of the situation.  Risks and benefits are discussed and informed consent is obtained.   Procedure:  The patient was identified and appropriate procedural time out was performed.  The patient was then placed supine on the table and prepped and draped in the usual sterile fashion. Moderate conscious sedation was administered during a face to face encounter with the patient throughout the procedure with my supervision of the RN administering medicines and monitoring the patient's vital signs, pulse oximetry, telemetry and mental status throughout from the start of the procedure until the patient was taken to the recovery room. Ultrasound was used to evaluate the left common femoral artery.  It was patent .  A digital ultrasound image was acquired.  A Seldinger needle was used to access the left common femoral artery under direct ultrasound guidance and a permanent image was performed.  A 0.035 J wire was advanced without resistance and a 5Fr sheath was placed.  Pigtail catheter was placed into the aorta and an AP aortogram was performed. This demonstrated normal renal arteries and normal aorta and iliac segments without significant stenosis neck arteries were quite small. I then crossed the aortic bifurcation and advanced to the right femoral head. Selective right lower extremity angiogram was then performed. This demonstrated essentially a flush occlusion of the right SFA with reconstitution of the distal SFA although the vessel  remained disease.  The popliteal artery then occluded just above the knee and there was an occluded stent in the distal popliteal artery.  There was some reconstitution of a posterior tibial artery distally although this was faint. It was felt that it was in the patient's best interest to proceed with intervention after these images to avoid a second procedure and a larger amount of contrast and fluoroscopy based off of the findings from the initial angiogram. The patient was systemically heparinized and a 6 [place] [employer] sheath was then placed over the [employer] wire. I then used a Kumpe catheter and the advantage wire to navigate down into the occluded SFA.  This was somewhat tedious and we had to exchange for a CXI and then a seeker catheter to get down into the popliteal artery just above the knee to confirm intraluminal flow.  Crossing the popliteal and tibial occlusions were even more tedious.  The previously placed short segment stent in the below-knee popliteal artery was difficult but eventually with the advantage wire and the seeker catheter I was able to cross through this and get down first into the anterior tibial artery and intraluminal flow was confirmed.  I then exchanged for a 0.018 wire.  The anterior tibial artery was treated with a 2.5 mm diameter by 30 cm length angioplasty balloon inflated to 8 atm for 1 minute.  The popliteal artery and SFA were treated with a 4 mm diameter by 22 cm length Lutonix drug-coated angioplasty balloon and a 5 mm diameter by 22 cm length Lutonix drug-coated angioplasty balloon in the distal and proximal segments respectively.  An additional 4 mm diameter by 10 cm Lutonix drug-coated angioplasty balloon was needed to get into the below-knee popliteal artery in the area of the previously placed stent.  Following this, there were multiple areas of greater than 50% residual stenosis most of which were in the 80 to 90% range in the SFA and popliteal artery.  The  anterior tibial artery was markedly improved but was really not continuous into the foot as it occluded above the ankle.  I then tediously use the Kumpe catheter and the advantage wire to get down into the tibioperoneal trunk and cross the occlusion of the tibioperoneal trunk and get down into the posterior tibial artery which was continuous to the foot.  Intraluminal flow was confirmed in the posterior tibial artery.  And there was greater than 70% stenosis in the proximal posterior tibial artery and a short segment occlusion of the tibioperoneal trunk.  The wire was then parked in the foot and the 2.5 mm diameter by 30 cm plastic balloon were used to treat the tibioperoneal trunk and proximal posterior tibial artery with an inflation to 12 atm for 1 minute.  Completion imaging showed less than 30% residual stenosis in the tibioperoneal trunk and less than 20% residual stenosis in the posterior tibial artery.  The popliteal and SFA still needed to be treated as the angioplasty result was  suboptimal.  A 6 mm diameter by 25 cm length Viabahn stent was deployed from the below-knee popliteal artery just below the previously placed stent up to the mid to distal SFA.  There was still disease proximal to this and a 6 mm diameter by 15 cm length Viabahn stent was deployed in the proximal and mid SFA bridging to the previously placed stent.  These were postdilated with a 5 mm diameter high-pressure angioplasty balloon with less than 20% residual stenosis seen in the SFA and popliteal arteries after stent placement. I elected to terminate the procedure. The sheath was removed and StarClose closure device was deployed in the left femoral artery with excellent hemostatic result. The patient was taken to the recovery room in stable condition having tolerated the procedure well.  Findings:               Aortogram:  Normal renal arteries, aorta and iliac arteries are small but not stenotic             Right lower Extremity:   This demonstrated essentially a flush occlusion of the right SFA with reconstitution of the distal SFA although the vessel remained disease.  The popliteal artery then occluded just above the knee and there was an occluded stent in the distal popliteal artery.  There was some reconstitution of a posterior tibial artery distally although this was faint   Disposition: Patient was taken to the recovery room in stable condition having tolerated the procedure well.  Complications: None  Leotis Pain 02/27/2018 11:11 AM   This note was created with Dragon Medical transcription system. Any errors in dictation are purely unintentional.

## 2018-02-27 NOTE — Discharge Instructions (Signed)
Moderate Conscious Sedation, Adult, Care After °These instructions provide you with information about caring for yourself after your procedure. Your health care provider may also give you more specific instructions. Your treatment has been planned according to current medical practices, but problems sometimes occur. Call your health care provider if you have any problems or questions after your procedure. °What can I expect after the procedure? °After your procedure, it is common: °· To feel sleepy for several hours. °· To feel clumsy and have poor balance for several hours. °· To have poor judgment for several hours. °· To vomit if you eat too soon. °Follow these instructions at home: °For at least 24 hours after the procedure: ° °· Do not: °? Participate in activities where you could fall or become injured. °? Drive. °? Use heavy machinery. °? Drink alcohol. °? Take sleeping pills or medicines that cause drowsiness. °? Make important decisions or sign legal documents. °? Take care of children on your own. °· Rest. °Eating and drinking °· Follow the diet recommended by your health care provider. °· If you vomit: °? Drink water, juice, or soup when you can drink without vomiting. °? Make sure you have little or no nausea before eating solid foods. °General instructions °· Have a responsible adult stay with you until you are awake and alert. °· Take over-the-counter and prescription medicines only as told by your health care provider. °· If you smoke, do not smoke without supervision. °· Keep all follow-up visits as told by your health care provider. This is important. °Contact a health care provider if: °· You keep feeling nauseous or you keep vomiting. °· You feel light-headed. °· You develop a rash. °· You have a fever. °Get help right away if: °· You have trouble breathing. °This information is not intended to replace advice given to you by your health care provider. Make sure you discuss any questions you have  with your health care provider. °Document Released: 11/08/2012 Document Revised: 06/23/2015 Document Reviewed: 05/10/2015 °Elsevier Interactive Patient Education © 2019 [employer] °Angiogram, Care After °This sheet gives you information about how to care for yourself after your procedure. Your doctor may also give you more specific instructions. If you have problems or questions, contact your doctor. °Follow these instructions at home: °Insertion site care °· Follow instructions from your doctor about how to take care of your long, thin tube (catheter) insertion area. Make sure you: °? Wash your hands with soap and water before you change your bandage (dressing). If you cannot use soap and water, use hand sanitizer. °? Change your bandage as told by your doctor. °? Leave stitches (sutures), skin glue, or skin tape (adhesive) strips in place. They may need to stay in place for 2 weeks or longer. If tape strips get loose and curl up, you may trim the loose edges. Do not remove tape strips completely unless your doctor says it is okay. °· Do not take baths, swim, or use a hot tub until your doctor says it is okay. °· You may shower 24-48 hours after the procedure or as told by your doctor. °? Gently wash the area with plain soap and water. °? Pat the area dry with a clean towel. °? Do not rub the area. This may cause bleeding. °· Do not apply powder or lotion to the area. Keep the area clean and dry. °· Check your insertion area every day for signs of infection. Check for: °? More redness, swelling, or pain. °?   Fluid or blood. °? Warmth. °? Pus or a bad smell. °Activity °· Rest as told by your doctor, usually for 1-2 days. °· Do not lift anything that is heavier than 10 lbs. (4.5 kg) or as told by your doctor. °· Do not drive for 24 hours if you were given a medicine to help you relax (sedative). °· Do not drive or use heavy machinery while taking prescription pain medicine. °General instructions ° °· Go back to  your normal activities as told by your doctor, usually in about a week. Ask your doctor what activities are safe for you. °· If the insertion area starts to bleed, lie flat and put pressure on the area. If the bleeding does not stop, get help right away. This is an emergency. °· Drink enough fluid to keep your pee (urine) clear or pale yellow. °· Take over-the-counter and prescription medicines only as told by your doctor. °· Keep all follow-up visits as told by your doctor. This is important. °Contact a doctor if: °· You have a fever. °· You have chills. °· You have more redness, swelling, or pain around your insertion area. °· You have fluid or blood coming from your insertion area. °· The insertion area feels warm to the touch. °· You have pus or a bad smell coming from your insertion area. °· You have more bruising around the insertion area. °· Blood collects in the tissue around the insertion area (hematoma) that may be painful to the touch. °Get help right away if: °· You have a lot of pain in the insertion area. °· The insertion area swells very fast. °· The insertion area is bleeding, and the bleeding does not stop after holding steady pressure on the area. °· The area near or just beyond the insertion area becomes pale, cool, tingly, or numb. °These symptoms may be an emergency. Do not wait to see if the symptoms will go away. Get medical help right away. Call your local emergency services (911 in the [place] Do not drive yourself to the hospital. °Summary °· After the procedure, it is common to have bruising and tenderness at the long, thin tube insertion area. °· After the procedure, it is important to rest and drink plenty of fluids. °· Do not take baths, swim, or use a hot tub until your doctor says it is okay to do so. You may shower 24-48 hours after the procedure or as told by your doctor. °· If the insertion area starts to bleed, lie flat and put pressure on the area. If the bleeding does not stop,  get help right away. This is an emergency. °This information is not intended to replace advice given to you by your health care provider. Make sure you discuss any questions you have with your health care provider. °Document Released: 04/16/2008 Document Revised: 01/13/2016 Document Reviewed: 01/13/2016 °Elsevier Interactive Patient Education © 2019 Elsevier Inc. ° °

## 2018-02-27 NOTE — H&P (Signed)
[facility] History & Physical Update  The patient was interviewed and re-examined.  The patient's previous History and Physical has been reviewed and is unchanged.  There is no change in the plan of care. We plan to proceed with the scheduled procedure.  Leotis Pain, MD  02/27/2018, 8:12 AM

## 2018-03-09 ENCOUNTER — Encounter: Payer: Self-pay | Admitting: Certified Registered"

## 2018-03-16 ENCOUNTER — Ambulatory Visit (INDEPENDENT_AMBULATORY_CARE_PROVIDER_SITE_OTHER): Payer: Medicaid Other | Admitting: Nurse Practitioner

## 2018-03-16 ENCOUNTER — Encounter (INDEPENDENT_AMBULATORY_CARE_PROVIDER_SITE_OTHER): Payer: Medicaid Other

## 2018-03-27 ENCOUNTER — Other Ambulatory Visit (INDEPENDENT_AMBULATORY_CARE_PROVIDER_SITE_OTHER): Payer: Self-pay | Admitting: Vascular Surgery

## 2018-03-27 ENCOUNTER — Encounter (INDEPENDENT_AMBULATORY_CARE_PROVIDER_SITE_OTHER): Payer: Self-pay | Admitting: Vascular Surgery

## 2018-03-27 ENCOUNTER — Ambulatory Visit (INDEPENDENT_AMBULATORY_CARE_PROVIDER_SITE_OTHER): Payer: Medicaid Other | Admitting: Vascular Surgery

## 2018-03-27 ENCOUNTER — Ambulatory Visit (INDEPENDENT_AMBULATORY_CARE_PROVIDER_SITE_OTHER): Payer: Medicaid Other

## 2018-03-27 ENCOUNTER — Other Ambulatory Visit: Payer: Self-pay

## 2018-03-27 VITALS — BP 165/88 | HR 123 | Resp 18 | Ht 63.5 in | Wt 99.8 lb

## 2018-03-27 DIAGNOSIS — Z95820 Peripheral vascular angioplasty status with implants and grafts: Secondary | ICD-10-CM

## 2018-03-27 DIAGNOSIS — I70235 Atherosclerosis of native arteries of right leg with ulceration of other part of foot: Secondary | ICD-10-CM

## 2018-03-27 DIAGNOSIS — E11319 Type 2 diabetes mellitus with unspecified diabetic retinopathy without macular edema: Secondary | ICD-10-CM | POA: Diagnosis not present

## 2018-03-27 DIAGNOSIS — L97519 Non-pressure chronic ulcer of other part of right foot with unspecified severity: Secondary | ICD-10-CM

## 2018-03-27 DIAGNOSIS — I739 Peripheral vascular disease, unspecified: Secondary | ICD-10-CM

## 2018-03-27 DIAGNOSIS — Z720 Tobacco use: Secondary | ICD-10-CM

## 2018-03-27 DIAGNOSIS — IMO0002 Reserved for concepts with insufficient information to code with codable children: Secondary | ICD-10-CM

## 2018-03-27 DIAGNOSIS — E785 Hyperlipidemia, unspecified: Secondary | ICD-10-CM | POA: Diagnosis not present

## 2018-03-27 DIAGNOSIS — E1165 Type 2 diabetes mellitus with hyperglycemia: Secondary | ICD-10-CM

## 2018-03-27 DIAGNOSIS — L97529 Non-pressure chronic ulcer of other part of left foot with unspecified severity: Secondary | ICD-10-CM

## 2018-03-27 DIAGNOSIS — Z794 Long term (current) use of insulin: Secondary | ICD-10-CM

## 2018-03-27 DIAGNOSIS — I70245 Atherosclerosis of native arteries of left leg with ulceration of other part of foot: Secondary | ICD-10-CM

## 2018-03-27 DIAGNOSIS — Z7689 Persons encountering health services in other specified circumstances: Secondary | ICD-10-CM | POA: Diagnosis not present

## 2018-03-28 ENCOUNTER — Encounter (INDEPENDENT_AMBULATORY_CARE_PROVIDER_SITE_OTHER): Payer: Self-pay | Admitting: Vascular Surgery

## 2018-03-28 DIAGNOSIS — Z720 Tobacco use: Secondary | ICD-10-CM | POA: Insufficient documentation
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
## 2018-03-29 ENCOUNTER — Telehealth: Payer: Self-pay

## 2018-03-29 ENCOUNTER — Other Ambulatory Visit: Payer: Self-pay | Admitting: Internal Medicine

## 2018-03-29 DIAGNOSIS — Z794 Long term (current) use of insulin: Principal | ICD-10-CM

## 2018-03-29 DIAGNOSIS — E1165 Type 2 diabetes mellitus with hyperglycemia: Principal | ICD-10-CM

## 2018-03-29 DIAGNOSIS — IMO0002 Reserved for concepts with insufficient information to code with codable children: Secondary | ICD-10-CM

## 2018-03-29 DIAGNOSIS — E11319 Type 2 diabetes mellitus with unspecified diabetic retinopathy without macular edema: Secondary | ICD-10-CM

## 2018-03-29 MED ORDER — GLUCOSE BLOOD VI STRP: ORAL_STRIP | 12 refills | Status: DC

## 2018-03-29 NOTE — Telephone Encounter (Signed)
Needs new RX for Accucheck Plus Strips. States she used more than needed when BS was elevated and BP has been high due to pain as well. Ins will not cover until 04/02/2018 and she new RX to read for 4-5l times day to check BS. Appt Monday here.

## 2018-03-31 ENCOUNTER — Ambulatory Visit: Payer: Self-pay | Admitting: Internal Medicine

## 2018-04-03 ENCOUNTER — Encounter: Payer: Self-pay | Admitting: Internal Medicine

## 2018-04-03 ENCOUNTER — Ambulatory Visit (INDEPENDENT_AMBULATORY_CARE_PROVIDER_SITE_OTHER): Payer: Medicaid Other | Admitting: Internal Medicine

## 2018-04-03 ENCOUNTER — Other Ambulatory Visit: Payer: Self-pay

## 2018-04-03 VITALS — BP 135/80 | HR 107 | Ht 63.0 in | Wt 101.0 lb

## 2018-04-03 DIAGNOSIS — E1142 Type 2 diabetes mellitus with diabetic polyneuropathy: Secondary | ICD-10-CM

## 2018-04-03 DIAGNOSIS — I70235 Atherosclerosis of native arteries of right leg with ulceration of other part of foot: Secondary | ICD-10-CM

## 2018-04-03 DIAGNOSIS — I10 Essential (primary) hypertension: Secondary | ICD-10-CM | POA: Diagnosis not present

## 2018-04-03 DIAGNOSIS — I70245 Atherosclerosis of native arteries of left leg with ulceration of other part of foot: Secondary | ICD-10-CM | POA: Diagnosis not present

## 2018-04-03 DIAGNOSIS — Z7689 Persons encountering health services in other specified circumstances: Secondary | ICD-10-CM | POA: Diagnosis not present

## 2018-04-03 MED ORDER — TRAMADOL HCL 50 MG PO TABS: 50.0000 mg | ORAL_TABLET | Freq: Four times a day (QID) | ORAL | 0 refills | Status: DC | PRN

## 2018-04-03 MED ORDER — LISINOPRIL 20 MG PO TABS: 20.0000 mg | ORAL_TABLET | Freq: Every day | ORAL | 1 refills | Status: DC
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
## 2018-04-03 NOTE — Patient Instructions (Signed)
Increase gabapentin to 2 capsules three times a day  Tramadol 50 mg - one a bedtime

## 2018-05-09 ENCOUNTER — Other Ambulatory Visit: Payer: Self-pay | Admitting: Internal Medicine

## 2018-05-09 ENCOUNTER — Telehealth: Payer: Self-pay

## 2018-05-09 DIAGNOSIS — Z794 Long term (current) use of insulin: Secondary | ICD-10-CM

## 2018-05-09 DIAGNOSIS — IMO0002 Reserved for concepts with insufficient information to code with codable children: Secondary | ICD-10-CM

## 2018-05-09 DIAGNOSIS — E1142 Type 2 diabetes mellitus with diabetic polyneuropathy: Secondary | ICD-10-CM

## 2018-05-09 DIAGNOSIS — E1165 Type 2 diabetes mellitus with hyperglycemia: Secondary | ICD-10-CM

## 2018-05-09 DIAGNOSIS — E11311 Type 2 diabetes mellitus with unspecified diabetic retinopathy with macular edema: Secondary | ICD-10-CM

## 2018-05-09 MED ORDER — GABAPENTIN 300 MG PO CAPS: 900.0000 mg | ORAL_CAPSULE | Freq: Three times a day (TID) | ORAL | 5 refills | Status: DC

## 2018-05-09 NOTE — Telephone Encounter (Signed)
Wants increase on Gabapentin as discussed

## 2018-06-27 ENCOUNTER — Ambulatory Visit: Payer: Self-pay | Admitting: Internal Medicine

## 2018-06-30 ENCOUNTER — Ambulatory Visit (INDEPENDENT_AMBULATORY_CARE_PROVIDER_SITE_OTHER): Payer: Medicaid Other | Admitting: Internal Medicine

## 2018-06-30 ENCOUNTER — Ambulatory Visit (INDEPENDENT_AMBULATORY_CARE_PROVIDER_SITE_OTHER): Payer: Medicaid Other | Admitting: Vascular Surgery

## 2018-06-30 ENCOUNTER — Other Ambulatory Visit: Payer: Self-pay

## 2018-06-30 ENCOUNTER — Encounter (INDEPENDENT_AMBULATORY_CARE_PROVIDER_SITE_OTHER): Payer: Medicaid Other

## 2018-06-30 ENCOUNTER — Encounter: Payer: Self-pay | Admitting: Internal Medicine

## 2018-06-30 VITALS — BP 132/70 | HR 82 | Ht 63.5 in | Wt 102.8 lb

## 2018-06-30 DIAGNOSIS — E785 Hyperlipidemia, unspecified: Secondary | ICD-10-CM

## 2018-06-30 DIAGNOSIS — I70235 Atherosclerosis of native arteries of right leg with ulceration of other part of foot: Secondary | ICD-10-CM

## 2018-06-30 DIAGNOSIS — I10 Essential (primary) hypertension: Secondary | ICD-10-CM | POA: Diagnosis not present

## 2018-06-30 DIAGNOSIS — E11311 Type 2 diabetes mellitus with unspecified diabetic retinopathy with macular edema: Secondary | ICD-10-CM

## 2018-06-30 DIAGNOSIS — E1169 Type 2 diabetes mellitus with other specified complication: Secondary | ICD-10-CM

## 2018-06-30 DIAGNOSIS — IMO0002 Reserved for concepts with insufficient information to code with codable children: Secondary | ICD-10-CM

## 2018-06-30 DIAGNOSIS — I70245 Atherosclerosis of native arteries of left leg with ulceration of other part of foot: Secondary | ICD-10-CM | POA: Diagnosis not present

## 2018-06-30 DIAGNOSIS — E1165 Type 2 diabetes mellitus with hyperglycemia: Secondary | ICD-10-CM

## 2018-06-30 DIAGNOSIS — E1142 Type 2 diabetes mellitus with diabetic polyneuropathy: Secondary | ICD-10-CM

## 2018-06-30 DIAGNOSIS — Z794 Long term (current) use of insulin: Secondary | ICD-10-CM

## 2018-06-30 MED ORDER — ACCU-CHEK AVIVA PLUS W/DEVICE KIT: 1.0000 | PACK | Freq: Two times a day (BID) | 0 refills | Status: DC

## 2018-06-30 MED ORDER — PREGABALIN 150 MG PO CAPS: 150.0000 mg | ORAL_CAPSULE | Freq: Two times a day (BID) | ORAL | 5 refills | Status: DC

## 2018-06-30 NOTE — Progress Notes (Signed)
Date:  06/30/2018   Name:  Sabrina Wells   DOB:  1960/05/09   MRN:  [id number]   Chief Complaint: Hypertension and Diabetes (Needs new meter. Her meter is no longer reading her strips. )  Hypertension  This is a chronic problem. The problem is controlled. Pertinent negatives include no blurred vision, chest pain, headaches, palpitations, peripheral edema or shortness of breath. Past treatments include ACE inhibitors.  Diabetes  She presents for her follow-up diabetic visit. She has type 2 diabetes mellitus. Her disease course has been stable. Pertinent negatives for hypoglycemia include no dizziness, headaches or nervousness/anxiousness. Pertinent negatives for diabetes include no blurred vision and no chest pain. Current diabetic treatment includes insulin injections. She is compliant with treatment most of the time. She is following a generally healthy diet. She monitors blood glucose at home 1-2 x per day. An ACE inhibitor/angiotensin II receptor blocker is being taken. She does not see a podiatrist.Eye exam is current.  Hyperlipidemia  This is a chronic problem. Associated symptoms include leg pain and myalgias. Pertinent negatives include no chest pain or shortness of breath. Current antihyperlipidemic treatment includes statins.  Leg Pain   The pain has been constant since onset. Associated symptoms include numbness. The symptoms are aggravated by weight bearing.  She has has revascularization procedures on both legs.  Most recently 5 stents on the right.  Now her whole leg aches from her hip to her ankle.  Pulses are good.  She is on gabapentin relatively high dose with no benefit.   Lab Results  Component Value Date   HGBA1C 8.5 (H) 02/24/2018   Lab Results  Component Value Date   CREATININE 0.62 02/24/2018   BUN 14 02/24/2018   NA 136 02/24/2018   K 6.5 (H) 02/24/2018   CL 98 02/24/2018   CO2 24 02/24/2018     Review of Systems  Constitutional: Negative for  chills, fever and unexpected weight change.  Eyes: Negative for blurred vision.  Respiratory: Negative for chest tightness, shortness of breath and wheezing.   Cardiovascular: Negative for chest pain, palpitations and leg swelling.  Musculoskeletal: Positive for arthralgias, back pain and myalgias.  Skin: Positive for wound (thick eschar at base of left 5th toe). Negative for color change and rash.  Neurological: Positive for numbness. Negative for dizziness and headaches.  Psychiatric/Behavioral: Negative for dysphoric mood and sleep disturbance. The patient is not nervous/anxious.     Patient Active Problem List   Diagnosis Date Noted  . Tobacco abuse 03/28/2018  . 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 History of colonic polyps 08/24/2017  . Post-traumatic stress 11/12/2015  . PVD (peripheral vascular disease) ([place]) 12/19/2014  . Uncontrolled type 2 diabetes mellitus with retinopathy, with long-term current use of insulin ([place]) 12/19/2014  . Diabetic polyneuropathy associated with type 2 diabetes mellitus ([place]) 12/19/2014  . Gastroparesis 07/20/2014  . Multiple pulmonary nodules determined by computed tomography of lung 07/20/2014  . Splenic infarct 07/17/2014  . Intractable abdominal pain 07/17/2014  . Essential hypertension 07/17/2014  . Hyperlipidemia 07/17/2014  . GAD (generalized anxiety disorder) 06/08/2014  . MDD (major depressive disorder), recurrent episode, moderate (HCC)     Allergies  Allergen Reactions  . Doxycycline Nausea And Vomiting  . Penicillins Hives  . Percocet [Oxycodone-Acetaminophen] Nausea And Vomiting  . Vicodin [Hydrocodone-Acetaminophen] Nausea And Vomiting    Past Surgical History:  Procedure Laterality Date  . colonscopy  2012  polyps  . LOWER EXTREMITY ANGIOGRAPHY Right 02/09/2018   Procedure: LOWER EXTREMITY ANGIOGRAPHY;  Surgeon: Algernon Huxley, MD;  Location: [facility];  Service: Cardiovascular;  Laterality: Right;  . LOWER EXTREMITY ANGIOGRAPHY Left 02/27/2018   Procedure: LOWER EXTREMITY ANGIOGRAPHY;  Surgeon: Algernon Huxley, MD;  Location: [facility];  Service: Cardiovascular;  Laterality: Left;  . TUBAL LIGATION    . vascular stent Right 2012   Lower leg    Social History   Tobacco Use  . Smoking status: Current Every Day Smoker    Packs/day: 0.50    Types: Cigarettes  . Smokeless tobacco: Never Used  Substance Use Topics  . Alcohol use: No    Alcohol/week: 2.0 standard drinks    Types: 2 Standard drinks or equivalent per week  . Drug use: Yes    Types: Marijuana    Comment: occassional      Medication list has been reviewed and updated.  Current Meds  Medication Sig  . aspirin EC 81 MG tablet Take 1 tablet (81 mg total) by mouth daily.  Marland Kitchen atorvastatin (LIPITOR) 10 MG tablet Take 1 tablet (10 mg total) by mouth daily.  . clopidogrel (PLAVIX) 75 MG tablet Take 1 tablet (75 mg total) by mouth daily.  Marland Kitchen gabapentin (NEURONTIN) 300 MG capsule Take 3 capsules (900 mg total) by mouth 3 (three) times daily.  Marland Kitchen glucose blood (ACCU-CHEK AVIVA PLUS) test strip Use 4-5 times per day  . insulin aspart (NOVOLOG FLEXPEN) 100 UNIT/ML FlexPen Inject 10 Units into the skin 3 (three) times daily with meals.  . Insulin Detemir (LEVEMIR FLEXTOUCH) 100 UNIT/ML Pen 12 units in the AM and 8 units in the PM  . Insulin Pen Needle 31G X 5 MM MISC 1 each by Does not apply route 4 (four) times daily.  Marland Kitchen lisinopril (PRINIVIL,ZESTRIL) 20 MG tablet Take 1 tablet (20 mg total) by mouth daily.  . traMADol (ULTRAM) 50 MG tablet Take 1 tablet (50 mg total) by mouth every 6 (six) hours as needed.    PHQ 2/9 Scores 06/30/2018 04/03/2018 02/24/2018 08/24/2017  PHQ - 2 Score 0 _0 PHQ- 9 Score - _1 BP Readings from Last 3 Encounters:  06/30/18 132/70  04/03/18 135/80  03/27/18 (!) 165/88    Physical Exam Vitals signs and nursing note reviewed.   Constitutional:      General: She is not in acute distress.    Appearance: She is well-developed.  HENT:     Head: Normocephalic and atraumatic.  Neck:     Musculoskeletal: Normal range of motion and neck supple.  Cardiovascular:     Rate and Rhythm: Normal rate and regular rhythm.     Pulses:          Dorsalis pedis pulses are 1+ on the right side and 1+ on the left side.  Pulmonary:     Effort: Pulmonary effort is normal. No respiratory distress.     Breath sounds: Normal breath sounds. No wheezing or rhonchi.  Abdominal:     General: Abdomen is flat. Bowel sounds are normal.     Palpations: Abdomen is soft.     Tenderness: There is abdominal tenderness. There is no guarding or rebound.     Hernia: No hernia is present.  Musculoskeletal: Normal range of motion.     Right lower leg: No edema.     Left lower leg: No edema.  Feet:  Skin:    General: Skin is warm and dry.     Findings: No rash.  Neurological:     Mental Status: She is alert and oriented to person, place, and time.  Psychiatric:        Attention and Perception: Attention normal.        Mood and Affect: Mood normal.        Behavior: Behavior normal.        Thought Content: Thought content normal.     Wt Readings from Last 3 Encounters:  06/30/18 102 lb 12.8 oz (46.6 kg)  04/03/18 101 lb (45.8 kg)  03/27/18 99 lb 12.8 oz (45.3 kg)    BP 132/70   Pulse 82   Ht 5' 3.5" (1.613 m)   Wt 102 lb 12.8 oz (46.6 kg)   SpO2 96%   BMI 17.92 kg/m   Assessment and Plan: 1. 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  Dispense: 1 kit; Refill: 0 - Hemoglobin A1c - Comprehensive metabolic panel  2. Essential hypertension  controlled  3. Hyperlipidemia associated with type 2 diabetes mellitus Continue statin therapy  4. Diabetic polyneuropathy associated with type 2 diabetes mellitus (HCC) Stop gabapentin and try lyrica - pregabalin (LYRICA) 150 MG capsule; Take 1 capsule (150 mg total) by mouth 2 (two) times daily.  Dispense: 60 capsule; Refill: 5  5. 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 Any errors are unintentional.  Laura Berglund, MD [facility]  06/30/2018     

## 2018-07-01 LAB — COMPREHENSIVE METABOLIC PANEL
ALT: 22 IU/L (ref 0–32)
AST: 24 IU/L (ref 0–40)
Albumin/Globulin Ratio: 1.8 (ref 1.2–2.2)
Albumin: 4.1 g/dL (ref 3.8–4.9)
Alkaline Phosphatase: 107 IU/L (ref 39–117)
BUN/Creatinine Ratio: 15 (ref 12–28)
BUN: 10 mg/dL (ref 8–27)
Bilirubin Total: <0.2 mg/dL (ref 0.0–1.2)
CO2: 22 mmol/L (ref 20–29)
Calcium: 8.9 mg/dL (ref 8.7–10.3)
Chloride: 99 mmol/L (ref 96–106)
Creatinine, Ser: 0.65 mg/dL (ref 0.57–1.00)
GFR calc Af Amer: 112 mL/min/{1.73_m2} (ref >59)
GFR calc non Af Amer: 97 mL/min/{1.73_m2} (ref >59)
Globulin, Total: 2.3 g/dL (ref 1.5–4.5)
Glucose: 176 mg/dL — ABNORMAL HIGH (ref 65–99)
Potassium: 4.4 mmol/L (ref 3.5–5.2)
Sodium: 138 mmol/L (ref 134–144)
Total Protein: 6.4 g/dL (ref 6.0–8.5)

## 2018-07-01 LAB — HEMOGLOBIN A1C
Est. average glucose Bld gHb Est-mCnc: 217 mg/dL
Hgb A1c MFr Bld: 9.2 % — ABNORMAL HIGH (ref 4.8–5.6)

## 2018-07-01 LAB — LIPID PANEL
Chol/HDL Ratio: 3 ratio (ref 0.0–4.4)
Cholesterol, Total: 136 mg/dL (ref 100–199)
HDL: 45 mg/dL (ref >39)
LDL Calculated: 74 mg/dL (ref 0–99)
Triglycerides: 87 mg/dL (ref 0–149)
VLDL Cholesterol Cal: 17 mg/dL (ref 5–40)

## 2018-07-11 ENCOUNTER — Other Ambulatory Visit: Payer: Self-pay | Admitting: Internal Medicine

## 2018-07-11 ENCOUNTER — Other Ambulatory Visit: Payer: Self-pay

## 2018-07-11 DIAGNOSIS — E11311 Type 2 diabetes mellitus with unspecified diabetic retinopathy with macular edema: Secondary | ICD-10-CM

## 2018-07-11 DIAGNOSIS — IMO0002 Reserved for concepts with insufficient information to code with codable children: Secondary | ICD-10-CM

## 2018-07-11 MED ORDER — ACCU-CHEK GUIDE ME W/DEVICE KIT: 1.0000 | PACK | Freq: Two times a day (BID) | 0 refills | Status: DC

## 2018-07-17 ENCOUNTER — Other Ambulatory Visit: Payer: Self-pay

## 2018-07-17 MED ORDER — ACCU-CHEK GUIDE ME W/DEVICE KIT: 1.0000 | PACK | Freq: Two times a day (BID) | 0 refills | Status: AC

## 2018-07-28 ENCOUNTER — Ambulatory Visit (INDEPENDENT_AMBULATORY_CARE_PROVIDER_SITE_OTHER): Payer: Medicaid Other | Admitting: Vascular Surgery

## 2018-07-28 ENCOUNTER — Encounter (INDEPENDENT_AMBULATORY_CARE_PROVIDER_SITE_OTHER): Payer: Medicaid Other

## 2018-08-14 ENCOUNTER — Other Ambulatory Visit: Payer: Self-pay | Admitting: Internal Medicine

## 2018-08-14 DIAGNOSIS — IMO0002 Reserved for concepts with insufficient information to code with codable children: Secondary | ICD-10-CM

## 2018-08-14 DIAGNOSIS — E11319 Type 2 diabetes mellitus with unspecified diabetic retinopathy without macular edema: Secondary | ICD-10-CM

## 2018-08-15 ENCOUNTER — Other Ambulatory Visit: Payer: Self-pay | Admitting: Internal Medicine

## 2018-08-15 ENCOUNTER — Telehealth: Payer: Self-pay

## 2018-08-15 DIAGNOSIS — IMO0002 Reserved for concepts with insufficient information to code with codable children: Secondary | ICD-10-CM

## 2018-08-15 DIAGNOSIS — E11319 Type 2 diabetes mellitus with unspecified diabetic retinopathy without macular edema: Secondary | ICD-10-CM

## 2018-08-15 MED ORDER — ACCU-CHEK GUIDE VI STRP: 1.0000 | ORAL_STRIP | Freq: Every day | 5 refills | Status: DC

## 2018-08-15 NOTE — Telephone Encounter (Signed)
Patient called saying she tests her BS five times daily. We sent her test strips in to test only twice daily.   Is she supposed to test 5 time daily?  Pharmacy: [facility]

## 2018-08-15 NOTE — Telephone Encounter (Signed)
Called and left VM informing patient of this.

## 2018-08-15 NOTE — Telephone Encounter (Signed)
I sent in a new Rx so she can test 5x/day if she needs to.

## 2018-08-18 ENCOUNTER — Other Ambulatory Visit: Payer: Self-pay

## 2018-08-18 MED ORDER — ACCU-CHEK FASTCLIX LANCETS MISC: 1.0000 | Freq: Every day | 5 refills | Status: AC

## 2018-09-05 ENCOUNTER — Encounter (INDEPENDENT_AMBULATORY_CARE_PROVIDER_SITE_OTHER): Payer: Medicaid Other

## 2018-09-05 ENCOUNTER — Ambulatory Visit (INDEPENDENT_AMBULATORY_CARE_PROVIDER_SITE_OTHER): Payer: Medicaid Other | Admitting: Vascular Surgery

## 2018-09-25 ENCOUNTER — Other Ambulatory Visit: Payer: Self-pay | Admitting: Internal Medicine

## 2018-09-25 DIAGNOSIS — I10 Essential (primary) hypertension: Secondary | ICD-10-CM

## 2018-10-03 ENCOUNTER — Ambulatory Visit (INDEPENDENT_AMBULATORY_CARE_PROVIDER_SITE_OTHER): Payer: Medicaid Other

## 2018-10-03 ENCOUNTER — Other Ambulatory Visit: Payer: Self-pay

## 2018-10-03 ENCOUNTER — Ambulatory Visit (INDEPENDENT_AMBULATORY_CARE_PROVIDER_SITE_OTHER): Payer: Medicaid Other | Admitting: Vascular Surgery

## 2018-10-03 ENCOUNTER — Encounter (INDEPENDENT_AMBULATORY_CARE_PROVIDER_SITE_OTHER): Payer: Self-pay

## 2018-10-03 ENCOUNTER — Encounter (INDEPENDENT_AMBULATORY_CARE_PROVIDER_SITE_OTHER): Payer: Self-pay | Admitting: Vascular Surgery

## 2018-10-03 VITALS — BP 163/81 | HR 73 | Resp 10 | Ht 63.0 in | Wt 110.0 lb

## 2018-10-03 DIAGNOSIS — I70235 Atherosclerosis of native arteries of right leg with ulceration of other part of foot: Secondary | ICD-10-CM

## 2018-10-03 DIAGNOSIS — I10 Essential (primary) hypertension: Secondary | ICD-10-CM | POA: Diagnosis not present

## 2018-10-03 DIAGNOSIS — IMO0002 Reserved for concepts with insufficient information to code with codable children: Secondary | ICD-10-CM

## 2018-10-03 DIAGNOSIS — I70245 Atherosclerosis of native arteries of left leg with ulceration of other part of foot: Secondary | ICD-10-CM | POA: Diagnosis not present

## 2018-10-03 DIAGNOSIS — E1165 Type 2 diabetes mellitus with hyperglycemia: Secondary | ICD-10-CM

## 2018-10-03 DIAGNOSIS — Z7689 Persons encountering health services in other specified circumstances: Secondary | ICD-10-CM | POA: Diagnosis not present

## 2018-10-03 DIAGNOSIS — E11319 Type 2 diabetes mellitus with unspecified diabetic retinopathy without macular edema: Secondary | ICD-10-CM

## 2018-10-03 DIAGNOSIS — Z720 Tobacco use: Secondary | ICD-10-CM

## 2018-10-03 DIAGNOSIS — Z794 Long term (current) use of insulin: Secondary | ICD-10-CM

## 2018-10-03 NOTE — Assessment & Plan Note (Signed)
blood glucose control important in reducing the progression of atherosclerotic disease. Also, involved in wound healing. On appropriate medications.  

## 2018-10-03 NOTE — Patient Instructions (Signed)
Peripheral Vascular Disease  Peripheral vascular disease (PVD) is a disease of the blood vessels that are not part of your heart and brain. A simple term for PVD is poor circulation. In most cases, PVD narrows the blood vessels that carry blood from your heart to the rest of your body. This can reduce the supply of blood to your arms, legs, and internal organs, like your stomach or kidneys. However, PVD most often affects a person's lower legs and feet. Without treatment, PVD tends to get worse. PVD can also lead to acute ischemic limb. This is when an arm or leg suddenly cannot get enough blood. This is a medical emergency. Follow these instructions at home: Lifestyle  Do not use any products that contain nicotine or tobacco, such as cigarettes and e-cigarettes. If you need help quitting, ask your doctor.  Lose weight if you are overweight. Or, stay at a healthy weight as told by your doctor.  Eat a diet that is low in fat and cholesterol. If you need help, ask your doctor.  Exercise regularly. Ask your doctor for activities that are right for you. General instructions  Take over-the-counter and prescription medicines only as told by your doctor.  Take good care of your feet: ? Wear comfortable shoes that fit well. ? Check your feet often for any cuts or sores.  Keep all follow-up visits as told by your doctor This is important. Contact a doctor if:  You have cramps in your legs when you walk.  You have leg pain when you are at rest.  You have coldness in a leg or foot.  Your skin changes.  You are unable to get or have an erection (erectile dysfunction).  You have cuts or sores on your feet that do not heal. Get help right away if:  Your arm or leg turns cold, numb, and blue.  Your arms or legs become red, warm, swollen, painful, or numb.  You have chest pain.  You have trouble breathing.  You suddenly have weakness in your face, arm, or leg.  You become very  confused or you cannot speak.  You suddenly have a very bad headache.  You suddenly cannot see. Summary  Peripheral vascular disease (PVD) is a disease of the blood vessels.  A simple term for PVD is poor circulation. Without treatment, PVD tends to get worse.  Treatment may include exercise, low fat and low cholesterol diet, and quitting smoking. This information is not intended to replace advice given to you by your health care provider. Make sure you discuss any questions you have with your health care provider. Document Released: 04/14/2009 Document Revised: 12/31/2016 Document Reviewed: 02/26/2016 Elsevier Patient Education  2020 [employer]  

## 2018-10-03 NOTE — Assessment & Plan Note (Signed)
Foot ulcers have now healed and she seems to be doing much better. Her noninvasive studies today show a right ABI of 1.23 and a left ABI 1.09.  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  She has some tibial disease on the left leg on duplex. Perfusion doing well at this point.  No need for intervention. Continue medical regimen.  Recheck in 6 months.

## 2018-10-03 NOTE — Assessment & Plan Note (Signed)
We had a discussion for approximately 3 minutes regarding the absolute need for smoking cessation due to the deleterious nature of tobacco on the vascular system. We discussed the tobacco use would diminish patency of any intervention, and likely significantly worsen progressio of disease. We discussed multiple agents for quitting including replacement therapy or medications to reduce cravings such as Chantix. The patient voices their understanding of the importance of smoking cessation.  She has cut way back and is doing much better with this but has not been entirely able to stop.

## 2018-10-03 NOTE — Assessment & Plan Note (Signed)
blood pressure control important in reducing the progression of atherosclerotic disease. On appropriate oral medications.  

## 2018-10-03 NOTE — Progress Notes (Signed)
MRN : [id number]  Sabrina Wells is a 58 y.o. (07/12/1960) female who presents with chief complaint of  Chief Complaint  Patient presents with   Follow-up  .  History of Present Illness: Patient returns today in follow up of her PAD.  She says she is doing much better than the last time we saw her.  Her follow-up was delayed somewhat due to COVID-19 but she returns today doing well.  No major claudication symptoms.  No ulceration or infection currently.  She is still smoking but she has cut back from 2 packs a day to less than half a pack a day.  We discussed that that is a marked improvement and if she is able to quit all the way that would be even better. Her noninvasive studies today show a right ABI of 1.23 and a left ABI 1.09.  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  She has some tibial disease on the left leg on duplex.  Current Outpatient Medications  Medication Sig Dispense Refill   aspirin EC 81 MG tablet Take 1 tablet (81 mg total) by mouth daily. 150 tablet 2   atorvastatin (LIPITOR) 10 MG tablet Take 1 tablet (10 mg total) by mouth daily. 30 tablet 11   clopidogrel (PLAVIX) 75 MG tablet Take 1 tablet (75 mg total) by mouth daily. 30 tablet 11   insulin aspart (NOVOLOG FLEXPEN) 100 UNIT/ML FlexPen Inject 10 Units into the skin 3 (three) times daily with meals. 15 mL 11   Insulin Detemir (LEVEMIR FLEXTOUCH) 100 UNIT/ML Pen 12 units in the AM and 8 units in the PM 6 pen 5   lisinopril (ZESTRIL) 20 MG tablet TAKE 1 TABLET BY MOUTH DAILY 90 tablet 1   pregabalin (LYRICA) 150 MG capsule Take 1 capsule (150 mg total) by mouth 2 (two) times daily. 60 capsule 5   Accu-Chek FastClix Lancets MISC 1 each by Does not apply route 5 (five) times daily. Use to test Blood Sugar up to 5 times daily. 150 each 5   Blood Glucose Monitoring Suppl (ACCU-CHEK GUIDE ME) w/Device KIT 1 each by Does  not apply route 2 (two) times a day. Test Blood Sugar twice daily 1 kit 0   glucose blood (ACCU-CHEK GUIDE) test strip 1 each by Other route 5 (five) times daily. Use as instructed 150 each 5   Insulin Pen Needle 31G X 5 MM MISC 1 each by Does not apply route 4 (four) times daily. 100 each 12   No current facility-administered medications for this visit.     Past Medical History:  Diagnosis Date   Breast mass 2017   Bil marked with BB's   Depressed    Diabetes mellitus without complication ([place])    Hypertension    Neuropathy    Vascular disease     Past Surgical History:  Procedure Laterality Date   colonscopy  2012   polyps   LOWER EXTREMITY ANGIOGRAPHY Right 02/09/2018   Procedure: LOWER EXTREMITY ANGIOGRAPHY;  Surgeon: Algernon Huxley, MD;  Location: [facility];  Service: Cardiovascular;  Laterality: Right;   LOWER EXTREMITY ANGIOGRAPHY Left 02/27/2018   Procedure: LOWER EXTREMITY ANGIOGRAPHY;  Surgeon: Algernon Huxley, MD;  Location: [facility];  Service: Cardiovascular;  Laterality: Left;   TUBAL LIGATION     vascular stent Right 2012   Lower leg    Social History  Social History   Tobacco Use   Smoking status: Current Every Day Smoker    Packs/day: 0.50    Types: Cigarettes   Smokeless tobacco: Never Used  Substance Use Topics   Alcohol use: No    Alcohol/week: 2.0 standard drinks    Types: 2 Standard drinks or equivalent per week   Drug use: Yes    Types: Marijuana    Comment: occassional     Family History Family History  Problem Relation Age of Onset   Diabetes Mother    Hypertension Mother    Throat cancer Mother    Thyroid disease Daughter    Breast cancer Neg Hx     Allergies  Allergen Reactions   Doxycycline Nausea And Vomiting   Penicillins Hives   Percocet [Oxycodone-Acetaminophen] Nausea And Vomiting   Vicodin [Hydrocodone-Acetaminophen] Nausea And Vomiting     REVIEW OF SYSTEMS (Negative unless  checked)  Constitutional: '[]'$ Weight loss  '[]'$ Fever  '[]'$ Chills Cardiac: '[]'$ Chest pain   '[]'$ Chest pressure   '[]'$ Palpitations   '[]'$ Shortness of breath when laying flat   '[]'$ Shortness of breath at rest   '[]'$ Shortness of breath with exertion. Vascular:  '[]'$ Pain in legs with walking   '[]'$ Pain in legs at rest   '[]'$ Pain in legs when laying flat   '[x]'$ Claudication   '[]'$ Pain in feet when walking  '[]'$ Pain in feet at rest  '[]'$ Pain in feet when laying flat   '[]'$ History of DVT   '[]'$ Phlebitis   '[]'$ Swelling in legs   '[]'$ Varicose veins   '[]'$ Non-healing ulcers Pulmonary:   '[]'$ Uses home oxygen   '[]'$ Productive cough   '[]'$ Hemoptysis   '[]'$ Wheeze  '[]'$ COPD   '[]'$ Asthma Neurologic:  '[]'$ Dizziness  '[]'$ Blackouts   '[]'$ Seizures   '[]'$ History of stroke   '[]'$ History of TIA  '[]'$ Aphasia   '[]'$ Temporary blindness   '[]'$ Dysphagia   '[]'$ Weakness or numbness in arms   '[]'$ Weakness or numbness in legs Musculoskeletal:  '[x]'$ Arthritis   '[]'$ Joint swelling   '[]'$ Joint pain   '[]'$ Low back pain Hematologic:  '[]'$ Easy bruising  '[]'$ Easy bleeding   '[]'$ Hypercoagulable state   '[]'$ Anemic   Gastrointestinal:  '[]'$ Blood in stool   '[]'$ Vomiting blood  '[x]'$ Gastroesophageal reflux/heartburn   '[]'$ Abdominal pain Genitourinary:  '[]'$ Chronic kidney disease   '[]'$ Difficult urination  '[]'$ Frequent urination  '[]'$ Burning with urination   '[]'$ Hematuria Skin:  '[]'$ Rashes   '[]'$ Ulcers   '[]'$ Wounds Psychological:  '[]'$ History of anxiety   '[x]'$  History of major depression.  Physical Examination  BP (!) 163/81 (BP Location: Left Arm, Patient Position: Sitting, Cuff Size: Normal)    Pulse 73    Resp 10    Ht '5\' 3"'$  (1.6 m)    Wt 110 lb (49.9 kg)    BMI 19.49 kg/m  Gen:  WD/WN, NAD Head: [place]/AT, No temporalis wasting. Ear/Nose/Throat: Hearing grossly intact, nares w/o erythema or drainage Eyes: Conjunctiva clear. Sclera non-icteric Neck: Supple.  Trachea midline Pulmonary:  Good air movement, no use of accessory muscles.  Cardiac: RRR, no JVD Vascular:  Vessel Right Left  Radial Palpable Palpable                          PT  Palpable 1+ Palpable  DP Trace Palpable 1+ Palpable   Gastrointestinal: soft, non-tender/non-distended. No guarding/reflex.  Musculoskeletal: M/S 5/5 throughout.  No deformity or atrophy. No edema. Neurologic: Sensation grossly intact in extremities.  Symmetrical.  Speech is fluent.  Psychiatric: Judgment intact, Mood & affect appropriate for pt's clinical situation. Dermatologic: No rashes or ulcers noted.  No cellulitis or open wounds.       Labs No results found for this or any previous visit (from the past 2160 hour(s)).  Radiology Vas [place] Burnard Bunting With/wo Tbi  Result Date: 10/03/2018 LOWER EXTREMITY DOPPLER STUDY Indications: Peripheral artery disease, and history of right stent unknown              location many years ago at another facility.  Vascular Interventions: 02/27/2018 PTA of thr right PTA and perioneal trunk. PTA                         of the right ATA. PTA of the right SFA and popliteal                         artery with stent. Comparison Study: 03/27/2018 Performing Technologist: Almira Coaster RVS  Examination Guidelines: A complete evaluation includes at minimum, Doppler waveform signals and systolic blood pressure reading at the level of bilateral brachial, anterior tibial, and posterior tibial arteries, when vessel segments are accessible. Bilateral testing is considered an integral part of a complete examination. Photoelectric Plethysmograph (PPG) waveforms and toe systolic pressure readings are included as required and additional duplex testing as needed. Limited examinations for reoccurring indications may be performed as noted.  ABI Findings: +---------+------------------+-----+----------+--------+  Right     Rt Pressure (mmHg) Index Waveform   Comment   +---------+------------------+-----+----------+--------+  Brachial  132                                           +---------+------------------+-----+----------+--------+  ATA       0                  0.00              Occluded  +---------+------------------+-----+----------+--------+  PTA       177                1.23  monophasic           +---------+------------------+-----+----------+--------+  Great Toe 101                0.70  Normal               +---------+------------------+-----+----------+--------+ +---------+------------------+-----+----------+-------+  Left      Lt Pressure (mmHg) Index Waveform   Comment  +---------+------------------+-----+----------+-------+  Brachial  144                                          +---------+------------------+-----+----------+-------+  ATA       157                1.09  monophasic          +---------+------------------+-----+----------+-------+  PTA       121                0.84  monophasic          +---------+------------------+-----+----------+-------+  Great Toe 104                0.72  Normal              +---------+------------------+-----+----------+-------+ +-------+-----------+-----------+------------+------------+  ABI/TBI Today's ABI Today's TBI Previous ABI Previous TBI  +-------+-----------+-----------+------------+------------+  Right   1.23        .70         1.04         .68           +-------+-----------+-----------+------------+------------+  Left    1.09        .72         .92          .47           +-------+-----------+-----------+------------+------------+ Bilateral ABIs appear essentially unchanged compared to prior study on 03/27/2018. Left TBIs appear increased compared to prior study on 03/27/2018.  Summary: Right: Resting right ankle-brachial index is within normal range. No evidence of significant right lower extremity arterial disease. The right toe-brachial index is normal. In comparison to [facility] Exam the ATA is occluded today. Left: Resting left ankle-brachial index is within normal range. No evidence of significant left lower extremity arterial disease. The left toe-brachial index is normal.  *See table(s) above for measurements and observations.   Electronically signed by Leotis Pain MD on 10/03/2018 at 3:45:12 PM.    Final    Vas [place] Lower Extremity Arterial Duplex  Result Date: 10/03/2018 LOWER EXTREMITY ARTERIAL DUPLEX STUDY Indications: Peripheral artery disease, and history of right stent unknown              location many years ago at another facility.  Vascular Interventions: 02/27/2018 PTA of thr right PTA and perioneal trunk. PTA                         of the right ATA. PTA of the right SFA and popliteal                         artery with stent. Current ABI:            Rt 1.23,Lt 1.09 Comparison Study: 03/27/2018 Performing Technologist: Almira Coaster RVS  Examination Guidelines: A complete evaluation includes B-mode imaging, spectral Doppler, color Doppler, and power Doppler as needed of all accessible portions of each vessel. Bilateral testing is considered an integral part of a complete examination. Limited examinations for reoccurring indications may be performed as noted.  +-----------+--------+-----+--------+--------+----------+  RIGHT       PSV cm/s Ratio Stenosis Waveform Comments    +-----------+--------+-----+--------+--------+----------+  CFA Distal  197                     biphasic             +-----------+--------+-----+--------+--------+----------+  DFA         99                      biphasic             +-----------+--------+-----+--------+--------+----------+  SFA Prox    41                      biphasic Prox Stent  +-----------+--------+-----+--------+--------+----------+  SFA Mid     28                      biphasic             +-----------+--------+-----+--------+--------+----------+  SFA Distal  31                      biphasic             +-----------+--------+-----+--------+--------+----------+  POP Distal  30                      biphasic Dist Stent  +-----------+--------+-----+--------+--------+----------+  ATA Distal  0                       Occluded             +-----------+--------+-----+--------+--------+----------+  PTA  Distal  67                      biphasic             +-----------+--------+-----+--------+--------+----------+  PERO Distal 0                       Occluded             +-----------+--------+-----+--------+--------+----------+  +-----------+--------+-----+--------+----------+--------+  LEFT        PSV cm/s Ratio Stenosis Waveform   Comments  +-----------+--------+-----+--------+----------+--------+  CFA Distal  122                     triphasic            +-----------+--------+-----+--------+----------+--------+  DFA         114                     biphasic             +-----------+--------+-----+--------+----------+--------+  SFA Prox    113                     biphasic             +-----------+--------+-----+--------+----------+--------+  SFA Mid     70                      biphasic             +-----------+--------+-----+--------+----------+--------+  SFA Distal  77                      biphasic             +-----------+--------+-----+--------+----------+--------+  POP Distal  73                      biphasic             +-----------+--------+-----+--------+----------+--------+  ATA Distal  87                      monophasic           +-----------+--------+-----+--------+----------+--------+  PTA Distal  9                       monophasic           +-----------+--------+-----+--------+----------+--------+  PERO Distal 0                       Occluded             +-----------+--------+-----+--------+----------+--------+  Summary: See table(s) above for measurements and observations. Electronically signed by Leotis Pain MD on 10/03/2018 at 3:45:07 PM.    Final     Assessment/Plan  Essential hypertension blood pressure control important in reducing the progression of atherosclerotic disease. On appropriate oral medications.   Uncontrolled type 2 diabetes mellitus with retinopathy, with long-term current use of insulin ([place])  blood glucose control important in reducing the progression of atherosclerotic disease.  Also, involved in wound healing. On appropriate medications.   Tobacco abuse We had a discussion for approximately 3 minutes regarding the absolute need for smoking cessation due to the deleterious nature of tobacco on the vascular system. We discussed the tobacco use would diminish patency of any intervention, and likely significantly worsen progressio of disease. We discussed multiple agents for quitting including replacement therapy or medications to reduce cravings such as Chantix. The patient voices their understanding of the importance of smoking cessation.  She has cut way back and is doing much better with this but has not been entirely able to stop.   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 Her noninvasive studies today show a right ABI of 1.23 and a left ABI 1.09.  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  She has some tibial disease on the left leg on duplex. Perfusion doing well at this point.  No need for intervention. Continue medical regimen.  Recheck in 6 months.    Leotis Pain, MD  10/03/2018 4:22 PM    This note was created with Dragon medical transcription system.  Any errors from dictation are purely unintentional

## 2018-10-05 ENCOUNTER — Other Ambulatory Visit: Payer: Self-pay

## 2018-10-05 DIAGNOSIS — IMO0002 Reserved for concepts with insufficient information to code with codable children: Secondary | ICD-10-CM

## 2018-10-05 DIAGNOSIS — E11319 Type 2 diabetes mellitus with unspecified diabetic retinopathy without macular edema: Secondary | ICD-10-CM

## 2018-10-05 MED ORDER — NOVOLOG FLEXPEN 100 UNIT/ML ~~LOC~~ SOPN: 10.0000 [IU] | PEN_INJECTOR | Freq: Three times a day (TID) | SUBCUTANEOUS | 0 refills | Status: DC

## 2018-10-31 ENCOUNTER — Ambulatory Visit: Payer: Medicaid Other | Admitting: Internal Medicine

## 2018-10-31 DIAGNOSIS — Z7689 Persons encountering health services in other specified circumstances: Secondary | ICD-10-CM | POA: Diagnosis not present

## 2018-11-10 ENCOUNTER — Ambulatory Visit (INDEPENDENT_AMBULATORY_CARE_PROVIDER_SITE_OTHER): Payer: Medicaid Other | Admitting: Internal Medicine

## 2018-11-10 ENCOUNTER — Encounter: Payer: Self-pay | Admitting: Internal Medicine

## 2018-11-10 ENCOUNTER — Other Ambulatory Visit: Payer: Self-pay

## 2018-11-10 VITALS — BP 136/80 | HR 68 | Resp 16 | Ht 63.0 in | Wt 110.0 lb

## 2018-11-10 DIAGNOSIS — I10 Essential (primary) hypertension: Secondary | ICD-10-CM | POA: Diagnosis not present

## 2018-11-10 DIAGNOSIS — Z23 Encounter for immunization: Secondary | ICD-10-CM

## 2018-11-10 DIAGNOSIS — I70245 Atherosclerosis of native arteries of left leg with ulceration of other part of foot: Secondary | ICD-10-CM

## 2018-11-10 DIAGNOSIS — E785 Hyperlipidemia, unspecified: Secondary | ICD-10-CM | POA: Diagnosis not present

## 2018-11-10 DIAGNOSIS — E1142 Type 2 diabetes mellitus with diabetic polyneuropathy: Secondary | ICD-10-CM | POA: Diagnosis not present

## 2018-11-10 DIAGNOSIS — I70235 Atherosclerosis of native arteries of right leg with ulceration of other part of foot: Secondary | ICD-10-CM | POA: Diagnosis not present

## 2018-11-10 DIAGNOSIS — Z1231 Encounter for screening mammogram for malignant neoplasm of breast: Secondary | ICD-10-CM

## 2018-11-10 DIAGNOSIS — E118 Type 2 diabetes mellitus with unspecified complications: Secondary | ICD-10-CM

## 2018-11-10 DIAGNOSIS — E1169 Type 2 diabetes mellitus with other specified complication: Secondary | ICD-10-CM | POA: Diagnosis not present

## 2018-11-10 DIAGNOSIS — Z7689 Persons encountering health services in other specified circumstances: Secondary | ICD-10-CM | POA: Diagnosis not present

## 2018-11-10 MED ORDER — GABAPENTIN 300 MG PO CAPS: 900.0000 mg | ORAL_CAPSULE | Freq: Three times a day (TID) | ORAL | 5 refills | Status: DC

## 2018-11-10 NOTE — Progress Notes (Signed)
Date:  11/10/2018   Name:  Sabrina Wells   DOB:  1960/11/06   MRN:  [id number]   Chief Complaint: Hypertension (since starting Lyrica BP has been severe low and then as high as 190/85 and patient feels drunk on this med. ), Diabetes (BS 120-300 range ), and Peripheral Neuropathy (wants to change Lyrica causing drunk feeling. ) She is due for mammogram and DM eye exam.  Diabetes She presents for her follow-up diabetic visit. She has type 2 diabetes mellitus. Her disease course has been improving. Hypoglycemia symptoms include dizziness (from lyrica). Pertinent negatives for hypoglycemia include no headaches, nervousness/anxiousness or tremors. Pertinent negatives for diabetes include no chest pain, no fatigue, no polydipsia, no polyuria and no weakness. Symptoms are stable. Diabetic complications include PVD. Current diabetic treatment includes insulin injections (levemir increased to 15 u AM and 10 u PM last visit). She is compliant with treatment most of the time. Her weight is stable. Her breakfast blood glucose is taken between 7-8 am. Her breakfast blood glucose range is generally 130-140 mg/dl. An ACE inhibitor/angiotensin II receptor blocker is being taken. Eye exam is not current.  Hypertension This is a chronic problem. The problem is unchanged. The problem is controlled (BP generally good but occasional high reading for unclear reasons.  Most readings at home 130/75). Pertinent negatives include no chest pain, headaches, palpitations or shortness of breath. Past treatments include ACE inhibitors. Hypertensive end-organ damage includes PVD.  Hyperlipidemia This is a chronic problem. The problem is controlled. Pertinent negatives include no chest pain or shortness of breath. Current antihyperlipidemic treatment includes statins. The current treatment provides significant improvement of lipids.  PAD - followed by Dr. Lucky Cowboy.  OV last month was good.  Summary: Foot ulcers have now  healed and she seems to be doing much better. Her noninvasive studies today show a right ABI of 1.23 and a left ABI 1.09.  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  She has some tibial disease on the left leg on duplex. Perfusion doing well at this point.  No need for intervention. Continue medical regimen.  Recheck in 6 months. Neuropathy from DM - changed to Lyrica from gabapentin in May but she feels drunk on Lyrica and has not really noted much improvement in pain. Lab Results  Component Value Date   HGBA1C 9.2 (H) 06/30/2018   Lab Results  Component Value Date   CREATININE 0.65 06/30/2018   BUN 10 06/30/2018   NA 138 06/30/2018   K 4.4 06/30/2018   CL 99 06/30/2018   CO2 22 06/30/2018   Lab Results  Component Value Date   CHOL 136 06/30/2018   HDL 45 06/30/2018   LDLCALC 74 06/30/2018   TRIG 87 06/30/2018   CHOLHDL 3.0 06/30/2018     Review of Systems  Constitutional: Negative for appetite change, chills, fatigue, fever and unexpected weight change.  HENT: Negative for tinnitus and trouble swallowing.   Eyes: Negative for visual disturbance.  Respiratory: Positive for cough and wheezing. Negative for chest tightness and shortness of breath.   Cardiovascular: Negative for chest pain, palpitations and leg swelling.  Gastrointestinal: Negative for abdominal pain, constipation and diarrhea.  Endocrine: Negative for polydipsia and polyuria.  Genitourinary: Negative for dysuria and hematuria.  Musculoskeletal: Negative for arthralgias.  Neurological: Positive for dizziness (from lyrica). Negative for tremors, weakness, numbness (and neuropathyic pain) and headaches.  Psychiatric/Behavioral: Negative  for dysphoric mood and sleep disturbance. The patient is not nervous/anxious.     Patient Active Problem List   Diagnosis Date Noted  . Tobacco abuse 03/28/2018  . 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 History of colonic polyps 08/24/2017  . Post-traumatic stress 11/12/2015  . PVD (peripheral vascular disease) ([place]) 12/19/2014  . Type II diabetes mellitus with complication ([place]) 67/20/9470  . Diabetic polyneuropathy associated with type 2 diabetes mellitus ([place]) 12/19/2014  . Gastroparesis 07/20/2014  . Multiple pulmonary nodules determined by computed tomography of lung 07/20/2014  . Splenic infarct 07/17/2014  . Intractable abdominal pain 07/17/2014  . Essential hypertension 07/17/2014  . Hyperlipidemia associated with type 2 diabetes mellitus ([place]) 07/17/2014  . GAD (generalized anxiety disorder) 06/08/2014  . MDD (major depressive disorder), recurrent episode, moderate (HCC)     Allergies  Allergen Reactions  . Doxycycline Nausea And Vomiting  . Penicillins Hives  . Percocet [Oxycodone-Acetaminophen] Nausea And Vomiting  . Vicodin [Hydrocodone-Acetaminophen] Nausea And Vomiting    Past Surgical History:  Procedure Laterality Date  . colonscopy  2012   polyps  . LOWER EXTREMITY ANGIOGRAPHY Right 02/09/2018   Procedure: LOWER EXTREMITY ANGIOGRAPHY;  Surgeon: Algernon Huxley, MD;  Location: [facility];  Service: Cardiovascular;  Laterality: Right;  . LOWER EXTREMITY ANGIOGRAPHY Left 02/27/2018   Procedure: LOWER EXTREMITY ANGIOGRAPHY;  Surgeon: Algernon Huxley, MD;  Location: [facility];  Service: Cardiovascular;  Laterality: Left;  . TUBAL LIGATION    . vascular stent Right 2012   Lower leg    Social History   Tobacco Use  . Smoking status: Current Every Day Smoker    Packs/day: 1.00    Years: 35.00    Pack years: 35.00    Types: Cigarettes    Start date: 11/10/1983  . Smokeless tobacco: Never Used  . Tobacco comment: slowed down a lot on smoking as of 11/10/18   Substance Use Topics  . Alcohol use: No    Alcohol/week: 2.0 standard drinks    Types: 2 Standard drinks or  equivalent per week  . Drug use: Yes    Types: Marijuana    Comment: occassional      Medication list has been reviewed and updated.  Current Meds  Medication Sig  . Accu-Chek FastClix Lancets MISC 1 each by Does not apply route 5 (five) times daily. Use to test Blood Sugar up to 5 times daily.  Marland Kitchen aspirin EC 81 MG tablet Take 1 tablet (81 mg total) by mouth daily.  Marland Kitchen atorvastatin (LIPITOR) 10 MG tablet Take 1 tablet (10 mg total) by mouth daily.  . Blood Glucose Monitoring Suppl (ACCU-CHEK GUIDE ME) w/Device KIT 1 each by Does not apply route 2 (two) times a day. Test Blood Sugar twice daily  . clopidogrel (PLAVIX) 75 MG tablet Take 1 tablet (75 mg total) by mouth daily.  Marland Kitchen glucose blood (ACCU-CHEK GUIDE) test strip 1 each by Other route 5 (five) times daily. Use as instructed  . insulin aspart (NOVOLOG FLEXPEN) 100 UNIT/ML FlexPen Inject 10 Units into the skin 3 (three) times daily with meals.  . Insulin Detemir (LEVEMIR FLEXTOUCH) 100 UNIT/ML Pen 12 units in the AM and 8 units in the PM (Patient taking differently: 2 (two) times daily. 15 units in the AM and 10 units in the PM)  . Insulin Pen Needle 31G X 5 MM MISC 1 each by Does not apply route 4 (  four) times daily.  Marland Kitchen lisinopril (ZESTRIL) 20 MG tablet TAKE 1 TABLET BY MOUTH DAILY  . pregabalin (LYRICA) 150 MG capsule Take 1 capsule (150 mg total) by mouth 2 (two) times daily.    PHQ 2/9 Scores 11/10/2018 06/30/2018 04/03/2018 02/24/2018  PHQ - 2 Score 2 0 6 2  PHQ- 9 Score 7 - 16 5    BP Readings from Last 3 Encounters:  11/10/18 136/80  10/03/18 (!) 163/81  06/30/18 132/70    Physical Exam Vitals signs and nursing note reviewed.  Constitutional:      General: She is not in acute distress.    Appearance: She is well-developed.  HENT:     Head: Normocephalic and atraumatic.  Neck:     Musculoskeletal: Normal range of motion and neck supple.  Cardiovascular:     Rate and Rhythm: Normal rate and regular rhythm.     Pulses:  Decreased pulses.     Heart sounds: No murmur.  Pulmonary:     Effort: Pulmonary effort is normal. No respiratory distress.     Breath sounds: Normal breath sounds. No wheezing or rhonchi.  Musculoskeletal:     Right lower leg: No edema.     Left lower leg: No edema.  Lymphadenopathy:     Cervical: No cervical adenopathy.  Skin:    General: Skin is warm and dry.     Capillary Refill: Capillary refill takes less than 2 seconds. In upper extremities    Findings: No rash.     Comments: Pre-ulcerative callus at base of 5th toe on left foot  Neurological:     Mental Status: She is alert and oriented to person, place, and time.  Psychiatric:        Behavior: Behavior normal.        Thought Content: Thought content normal.     Wt Readings from Last 3 Encounters:  11/10/18 110 lb (49.9 kg)  10/03/18 110 lb (49.9 kg)  06/30/18 102 lb 12.8 oz (46.6 kg)    BP 136/80   Pulse 68   Resp 16   Ht '5\' 3"'  (1.6 m)   Wt 110 lb (49.9 kg)   SpO2 98%   BMI 19.49 kg/m   Assessment and Plan: 1. Type II diabetes mellitus with complication (HCC) Clinically stable by exam and report without s/s of hypoglycemia. DM complicated by htn, lipids, PAD, neuropathy. Tolerating medications bid Levemir and novolog with each meal well without side effects or other concerns. Pt reminded to schedule DM eye exam - Hemoglobin A1c  2. Essential hypertension Clinically stable exam with well controlled BP.   Tolerating medications, lisinopril 20 mg, without side effects at this time. Pt to continue current regimen and low sodium diet; benefits of regular exercise as able discussed.  3. Hyperlipidemia associated with type 2 diabetes mellitus ([place]) Tolerating high intensity statin medication without side effects at this time LDL is at goal of < 70 on current dose Continue same therapy without change at this time.  4. Atherosclerosis of native artery of both lower extremities with bilateral ulceration of other  part of feet [facility]) S/p intervention and followed by Dr. Lucky Cowboy Recent evaluation appeared improved She is continuing to cut back on smoking   5. Encounter for screening mammogram for breast cancer - MM 3D SCREEN BREAST BILATERAL; Future  6. Diabetic polyneuropathy associated with type 2 diabetes mellitus ([place]) Stop lyrica - gabapentin (NEURONTIN) 300 MG capsule; Take 3 capsules (900 mg total) by mouth 3 (three)  times daily.  Dispense: 270 capsule; Refill: 5   Partially dictated using Editor, commissioning. Any errors are unintentional.  Halina Maidens, MD [facility]  11/10/2018

## 2018-11-11 LAB — HEMOGLOBIN A1C
Est. average glucose Bld gHb Est-mCnc: 217 mg/dL
Hgb A1c MFr Bld: 9.2 % — ABNORMAL HIGH (ref 4.8–5.6)

## 2019-02-16 ENCOUNTER — Other Ambulatory Visit: Payer: Self-pay | Admitting: Internal Medicine

## 2019-02-16 ENCOUNTER — Other Ambulatory Visit (INDEPENDENT_AMBULATORY_CARE_PROVIDER_SITE_OTHER): Payer: Self-pay | Admitting: Vascular Surgery

## 2019-02-16 DIAGNOSIS — E11319 Type 2 diabetes mellitus with unspecified diabetic retinopathy without macular edema: Secondary | ICD-10-CM

## 2019-02-16 DIAGNOSIS — IMO0002 Reserved for concepts with insufficient information to code with codable children: Secondary | ICD-10-CM

## 2019-02-27 ENCOUNTER — Other Ambulatory Visit: Payer: Self-pay | Admitting: Internal Medicine

## 2019-02-27 DIAGNOSIS — E11319 Type 2 diabetes mellitus with unspecified diabetic retinopathy without macular edema: Secondary | ICD-10-CM

## 2019-02-27 DIAGNOSIS — IMO0002 Reserved for concepts with insufficient information to code with codable children: Secondary | ICD-10-CM

## 2019-03-09 ENCOUNTER — Ambulatory Visit
Admission: RE | Admit: 2019-03-09 | Discharge: 2019-03-09 | Disposition: A | Discharge status: Home / Self Care | Payer: Medicaid Other | Source: Ambulatory Visit | Attending: Internal Medicine | Admitting: Internal Medicine

## 2019-03-09 DIAGNOSIS — Z7689 Persons encountering health services in other specified circumstances: Secondary | ICD-10-CM | POA: Diagnosis not present

## 2019-03-09 DIAGNOSIS — Z1231 Encounter for screening mammogram for malignant neoplasm of breast: Secondary | ICD-10-CM | POA: Insufficient documentation

## 2019-03-23 ENCOUNTER — Other Ambulatory Visit: Payer: Self-pay | Admitting: Internal Medicine

## 2019-03-23 DIAGNOSIS — I70245 Atherosclerosis of native arteries of left leg with ulceration of other part of foot: Secondary | ICD-10-CM

## 2019-03-23 DIAGNOSIS — I70235 Atherosclerosis of native arteries of right leg with ulceration of other part of foot: Secondary | ICD-10-CM

## 2019-04-03 ENCOUNTER — Ambulatory Visit (INDEPENDENT_AMBULATORY_CARE_PROVIDER_SITE_OTHER): Payer: Medicaid Other | Admitting: Vascular Surgery

## 2019-04-03 ENCOUNTER — Encounter (INDEPENDENT_AMBULATORY_CARE_PROVIDER_SITE_OTHER): Payer: Medicaid Other

## 2019-04-16 ENCOUNTER — Other Ambulatory Visit (INDEPENDENT_AMBULATORY_CARE_PROVIDER_SITE_OTHER): Payer: Self-pay | Admitting: Vascular Surgery

## 2019-04-16 DIAGNOSIS — I739 Peripheral vascular disease, unspecified: Secondary | ICD-10-CM

## 2019-04-17 ENCOUNTER — Encounter (INDEPENDENT_AMBULATORY_CARE_PROVIDER_SITE_OTHER): Payer: Medicaid Other

## 2019-04-17 ENCOUNTER — Ambulatory Visit (INDEPENDENT_AMBULATORY_CARE_PROVIDER_SITE_OTHER): Payer: Medicaid Other | Admitting: Vascular Surgery

## 2019-05-03 DIAGNOSIS — Z7689 Persons encountering health services in other specified circumstances: Secondary | ICD-10-CM | POA: Diagnosis not present

## 2019-05-03 DIAGNOSIS — H524 Presbyopia: Secondary | ICD-10-CM | POA: Diagnosis not present

## 2019-05-03 DIAGNOSIS — E119 Type 2 diabetes mellitus without complications: Secondary | ICD-10-CM | POA: Diagnosis not present

## 2019-05-03 LAB — HM DIABETES EYE EXAM

## 2019-05-15 ENCOUNTER — Encounter (INDEPENDENT_AMBULATORY_CARE_PROVIDER_SITE_OTHER): Payer: Medicaid Other

## 2019-05-15 ENCOUNTER — Ambulatory Visit (INDEPENDENT_AMBULATORY_CARE_PROVIDER_SITE_OTHER): Payer: Medicaid Other | Admitting: Vascular Surgery

## 2019-05-16 DIAGNOSIS — H25812 Combined forms of age-related cataract, left eye: Secondary | ICD-10-CM | POA: Diagnosis not present

## 2019-05-16 DIAGNOSIS — Z01818 Encounter for other preprocedural examination: Secondary | ICD-10-CM | POA: Diagnosis not present

## 2019-05-30 ENCOUNTER — Other Ambulatory Visit: Payer: Self-pay | Admitting: Internal Medicine

## 2019-05-30 DIAGNOSIS — I10 Essential (primary) hypertension: Secondary | ICD-10-CM

## 2019-05-30 NOTE — Telephone Encounter (Signed)
Requested medication (s) are due for refill today -yes  Requested medication (s) are on the active medication list -yes  Future visit scheduled -yes  Last refill: -03/23/19  Notes to clinic: Call to patient to schedule medication follow up- patient states she has cataract surgery this Friday and then 2 weeks after has the other eye scheduled. She asked if she can schedule after the eye surgery- patient scheduled for 07/07/19. Patient request RF until then.  Requested Prescriptions  Pending Prescriptions Disp Refills   lisinopril (ZESTRIL) 20 MG tablet [Pharmacy Med Name: LISINOPRIL 20 MG TAB] 90 tablet 1    Sig: TAKE 1 TABLET BY MOUTH DAILY      Cardiovascular:  ACE Inhibitors Failed - 05/30/2019  4:07 PM      Failed - Cr in normal range and within 180 days    Creatinine, Ser  Date Value Ref Range Status  06/30/2018 0.65 0.57 - 1.00 mg/dL Final          Failed - K in normal range and within 180 days    Potassium  Date Value Ref Range Status  06/30/2018 4.4 3.5 - 5.2 mmol/L Final   Potassium 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, with long-term current use of insulin [facility])   [facility] Glean Hess, MD   1 year ago Essential hypertension   [facility] Glean Hess, MD   1 year ago Uncontrolled type 2 diabetes mellitus with retinopathy, with long-term current use of insulin [facility])   [facility] Glean Hess, MD   1 year ago Uncontrolled type 2 diabetes mellitus with retinopathy, with  long-term current use of insulin [facility])   [facility] Glean Hess, MD       Future Appointments             In 1 month [facility] Melia Jesse Sans, MD [facility], [place] - Patient is not pregnant      Passed - Last BP in normal range    BP Readings from Last 1 Encounters:  11/10/18 136/80              Requested Prescriptions  Pending Prescriptions Disp Refills   lisinopril (ZESTRIL) 20 MG tablet [Pharmacy Med Name: LISINOPRIL 20 MG TAB] 90 tablet 1    Sig: TAKE 1 TABLET BY MOUTH DAILY      Cardiovascular:  ACE Inhibitors Failed - 05/30/2019  4:07 PM      Failed - Cr in normal range and within 180 days    Creatinine, Ser  Date Value Ref Range Status  06/30/2018 0.65 0.57 - 1.00 mg/dL Final          Failed - K in normal range and within 180 days    Potassium  Date Value Ref Range Status  06/30/2018 4.4 3.5 - 5.2  mmol/L Final   Potassium [facility] vascular lab)  Date Value Ref Range Status  02/27/2018 4.6 3.5 - 5.1 Final    Comment:    Performed at [facility], [address] [place], [place] [address]          Failed - Valid encounter within last 6 months    Recent Outpatient Visits           6 months ago Type II diabetes mellitus with complication [facility])   [facility] Glean Hess, MD   11 months ago 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, with long-term current use of insulin [facility])   [place], Laura H, MD   1 year ago Essential hypertension   [facility] Glean Hess, MD   1 year ago Uncontrolled type 2 diabetes mellitus with retinopathy, with long-term current use of insulin [facility])   [facility] Glean Hess, MD   1 year ago Uncontrolled type 2 diabetes mellitus with retinopathy, with long-term current use of insulin [facility])   [facility] Glean Hess, MD       Future  Appointments             In 1 month Glean Hess, MD [facility], [place] - Patient is not pregnant      Passed - Last BP in normal range    BP Readings from Last 1 Encounters:  11/10/18 136/80

## 2019-06-01 DIAGNOSIS — H25812 Combined forms of age-related cataract, left eye: Secondary | ICD-10-CM | POA: Diagnosis not present

## 2019-06-01 DIAGNOSIS — H2512 Age-related nuclear cataract, left eye: Secondary | ICD-10-CM | POA: Diagnosis not present

## 2019-06-05 ENCOUNTER — Other Ambulatory Visit: Payer: Self-pay | Admitting: Internal Medicine

## 2019-06-05 DIAGNOSIS — IMO0002 Reserved for concepts with insufficient information to code with codable children: Secondary | ICD-10-CM

## 2019-06-05 DIAGNOSIS — E11319 Type 2 diabetes mellitus with unspecified diabetic retinopathy without macular edema: Secondary | ICD-10-CM

## 2019-06-05 MED ORDER — ACCU-CHEK GUIDE VI STRP: 1.0000 | ORAL_STRIP | Freq: Every day | 5 refills | Status: DC

## 2019-06-05 NOTE — Telephone Encounter (Signed)
Medication Refill - Medication: glucose blood (ACCU-CHEK GUIDE) test strip    Preferred Pharmacy (with phone number or street name):  [facility] [place] Phone:  [phone]  Fax:  [phone]       Agent: Please be advised that RX refills may take up to 3 business days. We ask that you follow-up with your pharmacy.

## 2019-06-11 DIAGNOSIS — Z7689 Persons encountering health services in other specified circumstances: Secondary | ICD-10-CM | POA: Diagnosis not present

## 2019-06-11 DIAGNOSIS — Z961 Presence of intraocular lens: Secondary | ICD-10-CM | POA: Diagnosis not present

## 2019-06-15 DIAGNOSIS — E119 Type 2 diabetes mellitus without complications: Secondary | ICD-10-CM | POA: Diagnosis not present

## 2019-06-15 DIAGNOSIS — H2511 Age-related nuclear cataract, right eye: Secondary | ICD-10-CM | POA: Diagnosis not present

## 2019-06-15 DIAGNOSIS — H25811 Combined forms of age-related cataract, right eye: Secondary | ICD-10-CM | POA: Diagnosis not present

## 2019-06-27 DIAGNOSIS — Z7689 Persons encountering health services in other specified circumstances: Secondary | ICD-10-CM | POA: Diagnosis not present

## 2019-06-27 DIAGNOSIS — H2511 Age-related nuclear cataract, right eye: Secondary | ICD-10-CM | POA: Diagnosis not present

## 2019-07-09 ENCOUNTER — Other Ambulatory Visit: Payer: Self-pay

## 2019-07-09 ENCOUNTER — Encounter: Payer: Self-pay | Admitting: Internal Medicine

## 2019-07-09 ENCOUNTER — Ambulatory Visit (INDEPENDENT_AMBULATORY_CARE_PROVIDER_SITE_OTHER): Payer: Medicaid Other | Admitting: Internal Medicine

## 2019-07-09 VITALS — BP 132/60 | HR 81 | Temp 97.9°F | Ht 63.0 in | Wt 102.0 lb

## 2019-07-09 DIAGNOSIS — E881 Lipodystrophy, not elsewhere classified: Secondary | ICD-10-CM | POA: Diagnosis not present

## 2019-07-09 DIAGNOSIS — K122 Cellulitis and abscess of mouth: Secondary | ICD-10-CM

## 2019-07-09 DIAGNOSIS — Z7689 Persons encountering health services in other specified circumstances: Secondary | ICD-10-CM | POA: Diagnosis not present

## 2019-07-09 DIAGNOSIS — I70235 Atherosclerosis of native arteries of right leg with ulceration of other part of foot: Secondary | ICD-10-CM | POA: Diagnosis not present

## 2019-07-09 DIAGNOSIS — I70245 Atherosclerosis of native arteries of left leg with ulceration of other part of foot: Secondary | ICD-10-CM | POA: Diagnosis not present

## 2019-07-09 DIAGNOSIS — E1142 Type 2 diabetes mellitus with diabetic polyneuropathy: Secondary | ICD-10-CM

## 2019-07-09 DIAGNOSIS — F320 Major depressive disorder, single episode, mild: Secondary | ICD-10-CM | POA: Diagnosis not present

## 2019-07-09 DIAGNOSIS — I739 Peripheral vascular disease, unspecified: Secondary | ICD-10-CM | POA: Diagnosis not present

## 2019-07-09 DIAGNOSIS — I10 Essential (primary) hypertension: Secondary | ICD-10-CM | POA: Diagnosis not present

## 2019-07-09 DIAGNOSIS — E118 Type 2 diabetes mellitus with unspecified complications: Secondary | ICD-10-CM

## 2019-07-09 MED ORDER — TRAMADOL HCL 50 MG PO TABS: 50.0000 mg | ORAL_TABLET | Freq: Three times a day (TID) | ORAL | 0 refills | Status: AC | PRN

## 2019-07-09 MED ORDER — CLINDAMYCIN HCL 300 MG PO CAPS: 300.0000 mg | ORAL_CAPSULE | Freq: Three times a day (TID) | ORAL | 0 refills | Status: AC

## 2019-07-09 NOTE — Patient Instructions (Signed)
Schedule follow up with Dr. Lucky Cowboy as soon as possible.

## 2019-07-09 NOTE — Progress Notes (Signed)
Date:  07/09/2019   Name:  Sabrina Wells   DOB:  03/29/1960   MRN:  [id number]   Chief Complaint: Peripheral Neuropathy (follow up ), Diabetes (A1C 147 last checked), and Mouth Lesions (has a lump in mouth its making her head hurt wants a RF to a dentist who takes medicaid)  Diabetes She presents for her follow-up diabetic visit. She has type 2 diabetes mellitus. Her disease course has been fluctuating. Pertinent negatives for hypoglycemia include no dizziness, headaches or nervousness/anxiousness. Pertinent negatives for diabetes include no chest pain and no fatigue. Symptoms are stable. Current diabetic treatment includes insulin injections. She is compliant with treatment most of the time. Her weight is stable. There is no change in her home blood glucose trend. An ACE inhibitor/angiotensin II receptor blocker is being taken.  Mouth Lesions  The current episode started 5 to 7 days ago. The onset was sudden. The problem has been unchanged. The problem is severe. Nothing relieves the symptoms. The symptoms are aggravated by eating and drinking. Associated symptoms include mouth sores. Pertinent negatives include no fever, no abdominal pain, no constipation, no diarrhea, no ear pain, no headaches, no cough and no wheezing.  Hypertension This is a chronic problem. The problem is controlled. Pertinent negatives include no chest pain, headaches, palpitations or shortness of breath. Past treatments include ACE inhibitors.  Depression        This is a chronic problem.The problem is unchanged.  Associated symptoms include no fatigue and no headaches.  Past treatments include nothing. PVD - has a slowly healing sore on right big toe.  Supposed to follow up with Vascular surgery but cancelled last appt.  She does continue on Plavix and aspirin.  Lab Results  Component Value Date   CREATININE 0.65 06/30/2018   BUN 10 06/30/2018   NA 138 06/30/2018   K 4.4 06/30/2018   CL 99 06/30/2018   CO2 22 06/30/2018   Lab Results  Component Value Date   CHOL 136 06/30/2018   HDL 45 06/30/2018   LDLCALC 74 06/30/2018   TRIG 87 06/30/2018   CHOLHDL 3.0 06/30/2018   No results found for: TSH Lab Results  Component Value Date   HGBA1C 9.2 (H) 11/10/2018   Lab Results  Component Value Date   WBC 9.4 01/27/2017   HGB 14.2 01/27/2017   HCT 42.1 01/27/2017   MCV 89.7 01/27/2017   PLT 379 01/27/2017   Lab Results  Component Value Date   ALT 22 06/30/2018   AST 24 06/30/2018   ALKPHOS 107 06/30/2018   BILITOT <0.2 06/30/2018     Review of Systems  Constitutional: Negative for chills, fatigue, fever and unexpected weight change.  HENT: Positive for mouth sores. Negative for ear pain and trouble swallowing.   Respiratory: Negative for cough, chest tightness, shortness of breath and wheezing.   Cardiovascular: Negative for chest pain, palpitations and leg swelling.  Gastrointestinal: Negative for abdominal pain, constipation and diarrhea.  Genitourinary: Negative for dysuria and hematuria.  Skin: Positive for color change and wound.  Neurological: Negative for dizziness, light-headedness and headaches.  Psychiatric/Behavioral: Positive for depression. Negative for dysphoric mood and sleep disturbance. The patient is not nervous/anxious.     Patient Active Problem List   Diagnosis Date Noted  . Tobacco abuse 03/28/2018  . 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 History of colonic polyps 08/24/2017  . Post-traumatic stress 11/12/2015  . PVD (peripheral vascular  disease) ([place]) 12/19/2014  . Type II diabetes mellitus with complication ([place]) 79/43/2761  . Diabetic polyneuropathy associated with type 2 diabetes mellitus ([place]) 12/19/2014  . Gastroparesis 07/20/2014  . Multiple pulmonary nodules determined by computed tomography of lung 07/20/2014  . Splenic infarct 07/17/2014  . Intractable abdominal pain  07/17/2014  . Essential hypertension 07/17/2014  . Hyperlipidemia associated with type 2 diabetes mellitus ([place]) 07/17/2014  . GAD (generalized anxiety disorder) 06/08/2014  . MDD (major depressive disorder), recurrent episode, moderate (HCC)     Allergies  Allergen Reactions  . Doxycycline Nausea And Vomiting  . Penicillins Hives  . Percocet [Oxycodone-Acetaminophen] Nausea And Vomiting  . Vicodin [Hydrocodone-Acetaminophen] Nausea And Vomiting    Past Surgical History:  Procedure Laterality Date  . BREAST BIOPSY Left 2017   benign  . colonscopy  2012   polyps  . LOWER EXTREMITY ANGIOGRAPHY Right 02/09/2018   Procedure: LOWER EXTREMITY ANGIOGRAPHY;  Surgeon: Algernon Huxley, MD;  Location: [facility];  Service: Cardiovascular;  Laterality: Right;  . LOWER EXTREMITY ANGIOGRAPHY Left 02/27/2018   Procedure: LOWER EXTREMITY ANGIOGRAPHY;  Surgeon: Algernon Huxley, MD;  Location: [facility];  Service: Cardiovascular;  Laterality: Left;  . TUBAL LIGATION    . vascular stent Right 2012   Lower leg    Social History   Tobacco Use  . Smoking status: Current Every Day Smoker    Packs/day: 1.00    Years: 35.00    Pack years: 35.00    Types: Cigarettes    Start date: 11/10/1983  . Smokeless tobacco: Never Used  . Tobacco comment: slowed down a lot on smoking as of 11/10/18   Substance Use Topics  . Alcohol use: No    Alcohol/week: 2.0 standard drinks    Types: 2 Standard drinks or equivalent per week  . Drug use: Yes    Types: Marijuana    Comment: occassional      Medication list has been reviewed and updated.  Current Meds  Medication Sig  . Accu-Chek FastClix Lancets MISC 1 each by Does not apply route 5 (five) times daily. Use to test Blood Sugar up to 5 times daily.  Marland Kitchen aspirin EC 81 MG tablet Take 1 tablet (81 mg total) by mouth daily.  Marland Kitchen atorvastatin (LIPITOR) 10 MG tablet TAKE 1 TABLET BY MOUTH DAILY  . Blood Glucose Monitoring Suppl (ACCU-CHEK GUIDE ME)  w/Device KIT 1 each by Does not apply route 2 (two) times a day. Test Blood Sugar twice daily  . clopidogrel (PLAVIX) 75 MG tablet TAKE 1 TABLET BY MOUTH DAILY  . gabapentin (NEURONTIN) 300 MG capsule Take 3 capsules (900 mg total) by mouth 3 (three) times daily.  Marland Kitchen glucose blood (ACCU-CHEK GUIDE) test strip 1 each by Other route 5 (five) times daily. Use as instructed  . Insulin Detemir (LEVEMIR FLEXTOUCH) 100 UNIT/ML Pen Inject 25 Units into the skin daily. 15 units in the AM and 10 units in the PM  . Insulin Pen Needle 31G X 5 MM MISC 1 each by Does not apply route 4 (four) times daily.  Marland Kitchen lisinopril (ZESTRIL) 20 MG tablet TAKE 1 TABLET BY MOUTH DAILY  . NOVOLOG FLEXPEN 100 UNIT/ML FlexPen INJECT 10 UNTIS INTO THE SKIN 3 TIMES DAILY WITH MEALS    PHQ 2/9 Scores 07/09/2019 11/10/2018 06/30/2018 04/03/2018  PHQ - 2 Score 0 2 0 6  PHQ- 9 Score 0 7 - 16   GAD 7 : Generalized Anxiety Score 07/09/2019  Nervous, Anxious, on Edge 2  Control/stop worrying 2  Worry too much - different things 2  Trouble relaxing 2  Restless 0  Easily annoyed or irritable 2  Afraid - awful might happen 0  Total GAD 7 Score 10  Anxiety Difficulty Not difficult at all      BP Readings from Last 3 Encounters:  07/09/19 132/60  11/10/18 136/80  10/03/18 (!) 163/81    Physical Exam Vitals and nursing note reviewed.  Constitutional:      General: She is not in acute distress.    Appearance: She is well-developed.  HENT:     Head: Normocephalic and atraumatic.     Mouth/Throat:      Comments: Raised smooth fluctuant mass Neck:     Vascular: No carotid bruit.  Cardiovascular:     Rate and Rhythm: Normal rate and regular rhythm.  No extrasystoles are present.    Pulses:          Radial pulses are 1+ on the right side and 1+ on the left side.       Popliteal pulses are 0 on the right side and 0 on the left side.       Dorsalis pedis pulses are 0 on the right side and 0 on the left side.     Heart sounds:  Normal heart sounds. No murmur.  Pulmonary:     Effort: Pulmonary effort is normal. No respiratory distress.     Breath sounds: No wheezing or rhonchi.  Musculoskeletal:     Cervical back: Normal range of motion.     Right lower leg: No edema.     Left lower leg: No edema.  Lymphadenopathy:     Cervical: No cervical adenopathy.  Skin:    General: Skin is warm and dry.     Capillary Refill: Capillary refill takes less than 2 seconds.     Findings: No rash.          Comments: Significant lipodystrophy on both upper arms from injection insulin  Neurological:     Mental Status: She is alert and oriented to person, place, and time.     Sensory: Sensory deficit present.  Psychiatric:        Mood and Affect: Mood normal.        Behavior: Behavior normal.     Wt Readings from Last 3 Encounters:  07/09/19 102 lb (46.3 kg)  11/10/18 110 lb (49.9 kg)  10/03/18 110 lb (49.9 kg)    BP 132/60   Pulse 81   Temp 97.9 F (36.6 C) (Oral)   Ht '5\' 3"'  (1.6 m)   Wt 102 lb (46.3 kg)   SpO2 97%   BMI 18.07 kg/m   Assessment and Plan: 1. Type II diabetes mellitus with complication (HCC) Clinically stable by exam and report without s/s of hypoglycemia. DM complicated by PVD and HTN. Tolerating medications insulin well. 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 - Comprehensive metabolic panel - Hemoglobin A1c - POCT urinalysis dipstick  2. Diabetic polyneuropathy associated with type 2 diabetes mellitus ([place]) Stable; needs to continue with good foot hygiene and care Small ulcer on great toe is healing  3. Essential hypertension Clinically stable exam with well controlled BP. Tolerating medications without side effects at this time. Pt to continue current regimen and low sodium diet; benefits of regular exercise as able discussed. - CBC with Differential/Platelet - TSH  4. PVD (peripheral vascular disease) ([place]) Unable to palpate  pulses in either  LE Recommend that she re-schedule with VS asap  5. 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 MDD (major depressive disorder), recurrent episode, moderate (HCC) Mild anxiety at this time but no current depressive sx Remain off of medication unless sx worsen  7. Oral infection Unclear etiology but appears to be an abscess Will treat with antibiotics as a dental appt is likely to take some time - Ambulatory referral to Dentistry - clindamycin (CLEOCIN) 300 MG capsule; Take 1 capsule (300 mg total) by mouth 3 (three) times daily for 10 days.  Dispense: 30 capsule; Refill: 0 - traMADol (ULTRAM) 50 MG tablet; Take 1 tablet (50 mg total) by mouth every 8 (eight) hours as needed for up to 5 days.  Dispense: 15 tablet; Refill: 0  8. 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 Any errors are unintentional.  Halina Maidens, MD [facility]  07/09/2019

## 2019-07-10 ENCOUNTER — Other Ambulatory Visit: Payer: Self-pay | Admitting: Internal Medicine

## 2019-07-10 ENCOUNTER — Telehealth: Payer: Self-pay | Admitting: Internal Medicine

## 2019-07-10 DIAGNOSIS — K122 Cellulitis and abscess of mouth: Secondary | ICD-10-CM

## 2019-07-10 LAB — COMPREHENSIVE METABOLIC PANEL
ALT: 9 IU/L (ref 0–32)
AST: 18 IU/L (ref 0–40)
Albumin/Globulin Ratio: 1.4 (ref 1.2–2.2)
Albumin: 3.8 g/dL (ref 3.8–4.8)
Alkaline Phosphatase: 142 IU/L — ABNORMAL HIGH (ref 48–121)
BUN/Creatinine Ratio: 24 (ref 12–28)
BUN: 17 mg/dL (ref 8–27)
Bilirubin Total: <0.2 mg/dL (ref 0.0–1.2)
CO2: 22 mmol/L (ref 20–29)
Calcium: 8.6 mg/dL — ABNORMAL LOW (ref 8.7–10.3)
Chloride: 100 mmol/L (ref 96–106)
Creatinine, Ser: 0.72 mg/dL (ref 0.57–1.00)
GFR calc Af Amer: 105 mL/min/{1.73_m2} (ref >59)
GFR calc non Af Amer: 91 mL/min/{1.73_m2} (ref >59)
Globulin, Total: 2.7 g/dL (ref 1.5–4.5)
Glucose: 84 mg/dL (ref 65–99)
Potassium: 5.1 mmol/L (ref 3.5–5.2)
Sodium: 134 mmol/L (ref 134–144)
Total Protein: 6.5 g/dL (ref 6.0–8.5)

## 2019-07-10 LAB — LIPID PANEL
Chol/HDL Ratio: 3.3 ratio (ref 0.0–4.4)
Cholesterol, Total: 113 mg/dL (ref 100–199)
HDL: 34 mg/dL — ABNORMAL LOW (ref >39)
LDL Chol Calc (NIH): 52 mg/dL (ref 0–99)
Triglycerides: 159 mg/dL — ABNORMAL HIGH (ref 0–149)
VLDL Cholesterol Cal: 27 mg/dL (ref 5–40)

## 2019-07-10 LAB — CBC WITH DIFFERENTIAL/PLATELET
Basophils Absolute: 0.1 10*3/uL (ref 0.0–0.2)
Basos: 1 %
EOS (ABSOLUTE): 0.2 10*3/uL (ref 0.0–0.4)
Eos: 2 %
Hematocrit: 38.6 % (ref 34.0–46.6)
Hemoglobin: 13 g/dL (ref 11.1–15.9)
Immature Grans (Abs): 0 10*3/uL (ref 0.0–0.1)
Immature Granulocytes: 0 %
Lymphocytes Absolute: 2.6 10*3/uL (ref 0.7–3.1)
Lymphs: 25 %
MCH: 29.7 pg (ref 26.6–33.0)
MCHC: 33.7 g/dL (ref 31.5–35.7)
MCV: 88 fL (ref 79–97)
Monocytes Absolute: 0.7 10*3/uL (ref 0.1–0.9)
Monocytes: 7 %
Neutrophils Absolute: 6.7 10*3/uL (ref 1.4–7.0)
Neutrophils: 65 %
Platelets: 525 10*3/uL — ABNORMAL HIGH (ref 150–450)
RBC: 4.37 x10E6/uL (ref 3.77–5.28)
RDW: 12.4 % (ref 11.7–15.4)
WBC: 10.3 10*3/uL (ref 3.4–10.8)

## 2019-07-10 LAB — HEMOGLOBIN A1C
Est. average glucose Bld gHb Est-mCnc: 212 mg/dL
Hgb A1c MFr Bld: 9 % — ABNORMAL HIGH (ref 4.8–5.6)

## 2019-07-10 LAB — TSH: TSH: 2.85 u[IU]/mL (ref 0.450–4.500)

## 2019-07-10 MED ORDER — CEFUROXIME AXETIL 500 MG PO TABS: 500.0000 mg | ORAL_TABLET | Freq: Two times a day (BID) | ORAL | 0 refills | Status: DC

## 2019-07-10 NOTE — Telephone Encounter (Signed)
She is allergic to PCN which is the other antibiotic of choice.  What does she mean by "sick"?

## 2019-07-10 NOTE — Telephone Encounter (Signed)
Diarrhea and severe nausea was her symptoms. Told her a new abx was sent.  CM

## 2019-07-10 NOTE — Telephone Encounter (Signed)
Copied from [place] [id number]. Topic: General - Other >> Jul 10, 2019  8:13 AM Keene Breath wrote: Reason for CRM: Patient called to inform the doctor that the antibiotic, clindamycin (CLEOCIN) 300 MG capsule, that she prescribed is making her sick.  She would like to know if the doctor could prescribe another antibiotic.  Please advise and call patient to let her know at [phone].

## 2019-07-12 DIAGNOSIS — H5213 Myopia, bilateral: Secondary | ICD-10-CM | POA: Diagnosis not present

## 2019-07-12 DIAGNOSIS — Z7689 Persons encountering health services in other specified circumstances: Secondary | ICD-10-CM | POA: Diagnosis not present

## 2019-07-16 DIAGNOSIS — Z7689 Persons encountering health services in other specified circumstances: Secondary | ICD-10-CM | POA: Diagnosis not present

## 2019-07-24 ENCOUNTER — Telehealth: Payer: Self-pay | Admitting: Internal Medicine

## 2019-07-24 NOTE — Telephone Encounter (Signed)
Patient's pharmacy calling to request "unifine pen tips" on behalf of patient.    Pharmacy:  [place], [place] Phone:  [phone]  Fax:  [phone]

## 2019-07-25 ENCOUNTER — Other Ambulatory Visit: Payer: Self-pay | Admitting: Internal Medicine

## 2019-07-25 DIAGNOSIS — IMO0002 Reserved for concepts with insufficient information to code with codable children: Secondary | ICD-10-CM

## 2019-07-25 NOTE — Telephone Encounter (Signed)
Approved per protocol.  Requested Prescriptions  Pending Prescriptions Disp Refills  . 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

## 2019-07-25 NOTE — Telephone Encounter (Signed)
Called pt to let her know that her pens was refilled 07/25/2019. Pt verbalized understanding.  KP

## 2019-07-27 DIAGNOSIS — H524 Presbyopia: Secondary | ICD-10-CM | POA: Diagnosis not present

## 2019-08-13 ENCOUNTER — Other Ambulatory Visit: Payer: Self-pay | Admitting: Internal Medicine

## 2019-08-13 DIAGNOSIS — I10 Essential (primary) hypertension: Secondary | ICD-10-CM

## 2019-08-13 DIAGNOSIS — E1142 Type 2 diabetes mellitus with diabetic polyneuropathy: Secondary | ICD-10-CM

## 2019-11-09 ENCOUNTER — Ambulatory Visit (INDEPENDENT_AMBULATORY_CARE_PROVIDER_SITE_OTHER): Payer: Medicaid Other | Admitting: Internal Medicine

## 2019-11-09 ENCOUNTER — Encounter: Payer: Self-pay | Admitting: Internal Medicine

## 2019-11-09 ENCOUNTER — Other Ambulatory Visit: Payer: Self-pay

## 2019-11-09 VITALS — BP 112/60 | HR 92 | Temp 97.9°F | Ht 63.0 in | Wt 106.0 lb

## 2019-11-09 DIAGNOSIS — Z23 Encounter for immunization: Secondary | ICD-10-CM | POA: Diagnosis not present

## 2019-11-09 DIAGNOSIS — E785 Hyperlipidemia, unspecified: Secondary | ICD-10-CM

## 2019-11-09 DIAGNOSIS — E1142 Type 2 diabetes mellitus with diabetic polyneuropathy: Secondary | ICD-10-CM | POA: Diagnosis not present

## 2019-11-09 DIAGNOSIS — I10 Essential (primary) hypertension: Secondary | ICD-10-CM

## 2019-11-09 DIAGNOSIS — E1169 Type 2 diabetes mellitus with other specified complication: Secondary | ICD-10-CM | POA: Diagnosis not present

## 2019-11-09 DIAGNOSIS — E118 Type 2 diabetes mellitus with unspecified complications: Secondary | ICD-10-CM

## 2019-11-09 MED ORDER — LISINOPRIL 20 MG PO TABS: 20.0000 mg | ORAL_TABLET | Freq: Every day | ORAL | 1 refills | Status: DC

## 2019-11-09 NOTE — Progress Notes (Signed)
Date:  11/09/2019   Name:  Sabrina Wells   DOB:  07-Feb-1960   MRN:  [id number]   Chief Complaint: Diabetes (Follow up. Wants to discuss getting a handicap sticker. Referral to psychiatry - needs emotional support letter for her dog.), Hypertension (Follow up.), and Flu Vaccine (Reg dose. )  Hypertension This is a chronic problem. The problem is controlled. Pertinent negatives include no chest pain, headaches, palpitations or shortness of breath. Past treatments include ACE inhibitors. The current treatment provides significant improvement. There are no compliance problems.   Diabetes She presents for her follow-up diabetic visit. Her disease course has been stable. Pertinent negatives for hypoglycemia include no headaches, nervousness/anxiousness or tremors. Pertinent negatives for diabetes include no chest pain, no fatigue, no polydipsia, no polyuria and no weakness. Current diabetic treatment includes intensive insulin program. She is compliant with treatment most of the time. Her weight is stable. She monitors blood glucose at home 1-2 x per day. Her breakfast blood glucose range is generally 90-110 mg/dl. Her dinner blood glucose is taken between 6-7 pm. Her dinner blood glucose range is generally 180-200 mg/dl. An ACE inhibitor/angiotensin II receptor blocker is being taken.  Leg Pain  There was no injury mechanism. The pain is present in the left leg. The pain is moderate. The pain has been fluctuating since onset. Associated symptoms include an inability to bear weight and numbness. Treatments tried: gabapentin.    Lab Results  Component Value Date   CREATININE 0.72 07/09/2019   BUN 17 07/09/2019   NA 134 07/09/2019   K 5.1 07/09/2019   CL 100 07/09/2019   CO2 22 07/09/2019   Lab Results  Component Value Date   CHOL 113 07/09/2019   HDL 34 (L) 07/09/2019   LDLCALC 52 07/09/2019   TRIG 159 (H) 07/09/2019   CHOLHDL 3.3 07/09/2019   Lab Results  Component Value Date    TSH 2.850 07/09/2019   Lab Results  Component Value Date   HGBA1C 9.0 (H) 07/09/2019   Lab Results  Component Value Date   WBC 10.3 07/09/2019   HGB 13.0 07/09/2019   HCT 38.6 07/09/2019   MCV 88 07/09/2019   PLT 525 (H) 07/09/2019   Lab Results  Component Value Date   ALT 9 07/09/2019   AST 18 07/09/2019   ALKPHOS 142 (H) 07/09/2019   BILITOT <0.2 07/09/2019     Review of Systems  Constitutional: Negative for appetite change, fatigue, fever and unexpected weight change.  HENT: Negative for tinnitus and trouble swallowing.   Eyes: Negative for visual disturbance.  Respiratory: Negative for cough, chest tightness and shortness of breath.   Cardiovascular: Negative for chest pain, palpitations and leg swelling.  Gastrointestinal: Negative for abdominal pain.  Endocrine: Negative for polydipsia and polyuria.  Genitourinary: Negative for dysuria and hematuria.  Musculoskeletal: Positive for arthralgias, gait problem and myalgias.  Neurological: Positive for numbness. Negative for tremors, weakness and headaches.  Psychiatric/Behavioral: Negative for dysphoric mood and sleep disturbance. The patient is not nervous/anxious.     Patient Active Problem List   Diagnosis Date Noted  . 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 Tobacco abuse 03/28/2018  . 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 History of colonic polyps 08/24/2017  . Post-traumatic stress 11/12/2015  . PVD (peripheral vascular disease) ([place]) 12/19/2014  . Type II diabetes mellitus with complication ([place]) 40/98/1191  . Diabetic polyneuropathy associated with type 2 diabetes mellitus (  [place]) 12/19/2014  . Gastroparesis 07/20/2014  . Multiple pulmonary nodules determined by computed tomography of lung 07/20/2014  . Splenic infarct 07/17/2014  . Intractable abdominal pain 07/17/2014  . Essential hypertension 07/17/2014  . Hyperlipidemia associated  with type 2 diabetes mellitus ([place]) 07/17/2014  . GAD (generalized anxiety disorder) 06/08/2014    Allergies  Allergen Reactions  . Doxycycline Nausea And Vomiting  . Penicillins Hives  . Percocet [Oxycodone-Acetaminophen] Nausea And Vomiting  . Vicodin [Hydrocodone-Acetaminophen] Nausea And Vomiting    Past Surgical History:  Procedure Laterality Date  . BREAST BIOPSY Left 2017   benign  . colonscopy  2012   polyps  . LOWER EXTREMITY ANGIOGRAPHY Right 02/09/2018   Procedure: LOWER EXTREMITY ANGIOGRAPHY;  Surgeon: Algernon Huxley, MD;  Location: [facility];  Service: Cardiovascular;  Laterality: Right;  . LOWER EXTREMITY ANGIOGRAPHY Left 02/27/2018   Procedure: LOWER EXTREMITY ANGIOGRAPHY;  Surgeon: Algernon Huxley, MD;  Location: [facility];  Service: Cardiovascular;  Laterality: Left;  . TUBAL LIGATION    . vascular stent Right 2012   Lower leg    Social History   Tobacco Use  . Smoking status: Current Every Day Smoker    Packs/day: 1.00    Years: 35.00    Pack years: 35.00    Types: Cigarettes    Start date: 11/10/1983  . Smokeless tobacco: Never Used  . Tobacco comment: slowed down a lot on smoking as of 11/10/18   Vaping Use  . Vaping Use: Never used  Substance Use Topics  . Alcohol use: No    Alcohol/week: 2.0 standard drinks    Types: 2 Standard drinks or equivalent per week  . Drug use: Yes    Types: Marijuana    Comment: occassional      Medication list has been reviewed and updated.  Current Meds  Medication Sig  . Accu-Chek FastClix Lancets MISC 1 each by Does not apply route 5 (five) times daily. Use to test Blood Sugar up to 5 times daily.  Marland Kitchen aspirin EC 81 MG tablet Take 1 tablet (81 mg total) by mouth daily.  Marland Kitchen atorvastatin (LIPITOR) 10 MG tablet TAKE 1 TABLET BY MOUTH DAILY  . Blood Glucose Monitoring Suppl (ACCU-CHEK GUIDE ME) w/Device KIT 1 each by Does not apply route 2 (two) times a day. Test Blood Sugar twice daily  . clopidogrel  (PLAVIX) 75 MG tablet TAKE 1 TABLET BY MOUTH DAILY  . gabapentin (NEURONTIN) 300 MG capsule TAKE 3 CAPSULES BY MOUTH 3 TIMES A DAY.  Marland Kitchen glucose blood (ACCU-CHEK GUIDE) test strip 1 each by Other route 5 (five) times daily. Use as instructed  . Insulin Detemir (LEVEMIR FLEXTOUCH) 100 UNIT/ML Pen Inject 25 Units into the skin daily. 15 units in the AM and 10 units in the PM  . lisinopril (ZESTRIL) 20 MG tablet TAKE 1 TABLET BY MOUTH DAILY  . NOVOLOG FLEXPEN 100 UNIT/ML FlexPen INJECT 10 UNTIS INTO THE SKIN 3 TIMES DAILY WITH MEALS  . UNIFINE PENTIPS 31G X 5 MM MISC USE 1 PEN NEEDLE 4 TIMES DAILY    PHQ 2/9 Scores 11/09/2019 07/09/2019 11/10/2018 06/30/2018  PHQ - 2 Score 2 0 2 0  PHQ- 9 Score 8 0 7 -    GAD 7 : Generalized Anxiety Score 11/09/2019 07/09/2019  Nervous, Anxious, on Edge 1 2  Control/stop worrying 1 2  Worry too much - different things 1 2  Trouble relaxing 0 2  Restless 0 0  Easily  annoyed or irritable 0 2  Afraid - awful might happen 0 0  Total GAD 7 Score 3 10  Anxiety Difficulty Not difficult at all Not difficult at all    BP Readings from Last 3 Encounters:  11/09/19 112/60  07/09/19 132/60  11/10/18 136/80    Physical Exam Vitals and nursing note reviewed.  Constitutional:      General: She is not in acute distress.    Appearance: She is well-developed.  HENT:     Head: Normocephalic and atraumatic.  Neck:     Vascular: No carotid bruit.  Cardiovascular:     Rate and Rhythm: Normal rate and regular rhythm.     Pulses: Normal pulses.  Pulmonary:     Effort: Pulmonary effort is normal. No respiratory distress.     Breath sounds: Decreased breath sounds present. No wheezing or rhonchi.  Musculoskeletal:        General: Normal range of motion.     Cervical back: Normal range of motion.  Lymphadenopathy:     Cervical: No cervical adenopathy.  Skin:    General: Skin is warm and dry.     Findings: No rash.  Neurological:     Mental Status: She is alert and  oriented to person, place, and time.  Psychiatric:        Attention and Perception: Attention normal.        Mood and Affect: Mood normal.     Wt Readings from Last 3 Encounters:  11/09/19 106 lb (48.1 kg)  07/09/19 102 lb (46.3 kg)  11/10/18 110 lb (49.9 kg)    BP 112/60   Pulse 92   Temp 97.9 F (36.6 C) (Oral)   Ht '5\' 3"'  (1.6 m)   Wt 106 lb (48.1 kg)   SpO2 96%   BMI 18.78 kg/m   Assessment and Plan: 1. Type II diabetes mellitus with complication (HCC) On intensive insulin therapy with good compliance Testing BS at least twice per day Continue current regimen Eye exam up to date - Hemoglobin A1c  2. Essential hypertension Clinically stable exam with well controlled BP on lisinopril 20 mg. Tolerating medications without side effects at this time. Pt to continue current regimen and low sodium diet; benefits of regular exercise as able discussed. - Comprehensive metabolic panel  3. Hyperlipidemia associated with type 2 diabetes mellitus ([place]) On statin therapy - last LDL at goal (52) Continue atorvastatin 10 mg per day - Lipid panel  4. Diabetic polyneuropathy associated with type 2 diabetes mellitus ([place]) Continue gabapentin Handicapped placard application given   Partially dictated using Editor, commissioning. Any errors are unintentional.  Halina Maidens, MD [facility]  11/09/2019

## 2019-11-10 LAB — COMPREHENSIVE METABOLIC PANEL
ALT: 14 IU/L (ref 0–32)
AST: 19 IU/L (ref 0–40)
Albumin/Globulin Ratio: 1.8 (ref 1.2–2.2)
Albumin: 3.9 g/dL (ref 3.8–4.8)
Alkaline Phosphatase: 109 IU/L (ref 44–121)
BUN/Creatinine Ratio: 13 (ref 12–28)
BUN: 9 mg/dL (ref 8–27)
Bilirubin Total: <0.2 mg/dL (ref 0.0–1.2)
CO2: 24 mmol/L (ref 20–29)
Calcium: 8.7 mg/dL (ref 8.7–10.3)
Chloride: 102 mmol/L (ref 96–106)
Creatinine, Ser: 0.71 mg/dL (ref 0.57–1.00)
GFR calc Af Amer: 106 mL/min/{1.73_m2} (ref >59)
GFR calc non Af Amer: 92 mL/min/{1.73_m2} (ref >59)
Globulin, Total: 2.2 g/dL (ref 1.5–4.5)
Glucose: 236 mg/dL — ABNORMAL HIGH (ref 65–99)
Potassium: 5 mmol/L (ref 3.5–5.2)
Sodium: 137 mmol/L (ref 134–144)
Total Protein: 6.1 g/dL (ref 6.0–8.5)

## 2019-11-10 LAB — HEMOGLOBIN A1C
Est. average glucose Bld gHb Est-mCnc: 220 mg/dL
Hgb A1c MFr Bld: 9.3 % — ABNORMAL HIGH (ref 4.8–5.6)

## 2019-11-10 LAB — LIPID PANEL
Chol/HDL Ratio: 3.5 ratio (ref 0.0–4.4)
Cholesterol, Total: 149 mg/dL (ref 100–199)
HDL: 43 mg/dL (ref >39)
LDL Chol Calc (NIH): 88 mg/dL (ref 0–99)
Triglycerides: 99 mg/dL (ref 0–149)
VLDL Cholesterol Cal: 18 mg/dL (ref 5–40)

## 2020-02-15 ENCOUNTER — Other Ambulatory Visit: Payer: Self-pay | Admitting: Internal Medicine

## 2020-02-15 DIAGNOSIS — E1142 Type 2 diabetes mellitus with diabetic polyneuropathy: Secondary | ICD-10-CM

## 2020-02-22 ENCOUNTER — Telehealth: Payer: Self-pay

## 2020-02-22 NOTE — Telephone Encounter (Signed)
Pt called Sabrina Wells and told her that she was moving and wanted her information changed.  Spoke to pt she stated she is moving to [place] at the end of the month and wanted to know if we could refer her to another doctor where she's moving. Told pt i'm unaware of any place that she could go to in [place] i'm not familiar with the area. Told pt call medicaid change her information and maybe hey could help her find a PCP in [place]. Also told pt when she finds another PCP she will sin a release of information form and they will fax that to [place]. Pt asked about RF on medication when she's needs it she wanted it sent to a different pharmacy. Told pt to call her pharmacy and have them tranfer her meds for her. Pt stated she will wait to transfer he meds.  Pt verbalized understanding.  KP

## 2020-03-10 ENCOUNTER — Ambulatory Visit: Payer: Medicaid Other | Admitting: Internal Medicine

## 2020-03-25 ENCOUNTER — Other Ambulatory Visit: Payer: Self-pay

## 2020-03-25 ENCOUNTER — Telehealth: Payer: Self-pay

## 2020-03-25 DIAGNOSIS — E11319 Type 2 diabetes mellitus with unspecified diabetic retinopathy without macular edema: Secondary | ICD-10-CM

## 2020-03-25 DIAGNOSIS — IMO0002 Reserved for concepts with insufficient information to code with codable children: Secondary | ICD-10-CM

## 2020-03-25 DIAGNOSIS — I70235 Atherosclerosis of native arteries of right leg with ulceration of other part of foot: Secondary | ICD-10-CM

## 2020-03-25 DIAGNOSIS — I10 Essential (primary) hypertension: Secondary | ICD-10-CM

## 2020-03-25 DIAGNOSIS — E1142 Type 2 diabetes mellitus with diabetic polyneuropathy: Secondary | ICD-10-CM

## 2020-03-25 MED ORDER — LISINOPRIL 20 MG PO TABS: 20.0000 mg | ORAL_TABLET | Freq: Every day | ORAL | 0 refills | Status: AC

## 2020-03-25 MED ORDER — ATORVASTATIN CALCIUM 10 MG PO TABS
10.0000 mg | ORAL_TABLET | Freq: Every day | ORAL | 0 refills | Status: DC
Start: 2020-03-25 — End: 2020-07-10

## 2020-03-25 MED ORDER — CLOPIDOGREL BISULFATE 75 MG PO TABS: 75.0000 mg | ORAL_TABLET | Freq: Every day | ORAL | 0 refills | Status: AC

## 2020-03-25 MED ORDER — LEVEMIR FLEXTOUCH 100 UNIT/ML ~~LOC~~ SOPN: 25.0000 [IU] | PEN_INJECTOR | Freq: Every day | SUBCUTANEOUS | 0 refills | Status: AC

## 2020-03-25 MED ORDER — GABAPENTIN 300 MG PO CAPS: ORAL_CAPSULE | ORAL | 0 refills | Status: DC

## 2020-03-25 NOTE — Telephone Encounter (Unsigned)
Copied from [place] [id number]. Topic: General - Inquiry >> Mar 25, 2020  3:32 PM Greggory Keen D wrote: Reason for CRM: pt called saying she has prescriptions that need to be refilled.  She has moved to [place] and uses [address] in [place].  She is trying to get a new primary care doctor there but hasn't found one yet,  She is going to have to contact medicaid for assistance.  CB#   [phone]

## 2020-03-25 NOTE — Telephone Encounter (Signed)
Called and spoke with patient and sent in RFs and told her she will need to find a PCP in her area to continue medications and continuing care.   CM

## 2020-04-05 ENCOUNTER — Other Ambulatory Visit: Payer: Self-pay | Admitting: Family Medicine

## 2020-04-05 ENCOUNTER — Telehealth: Payer: Self-pay | Admitting: Family Medicine

## 2020-04-05 DIAGNOSIS — E11319 Type 2 diabetes mellitus with unspecified diabetic retinopathy without macular edema: Secondary | ICD-10-CM

## 2020-04-05 DIAGNOSIS — IMO0002 Reserved for concepts with insufficient information to code with codable children: Secondary | ICD-10-CM

## 2020-04-05 MED ORDER — ACCU-CHEK GUIDE VI STRP
1.0000 | ORAL_STRIP | Freq: Every day | 0 refills | Status: DC
Start: 2020-04-05 — End: 2020-05-02

## 2020-04-05 MED ORDER — NOVOLOG FLEXPEN 100 UNIT/ML ~~LOC~~ SOPN: PEN_INJECTOR | SUBCUTANEOUS | 0 refills | Status: AC

## 2020-04-05 NOTE — Telephone Encounter (Signed)
Called by on call nurse. Patient has moved and states that all her meds were sent to her new pharmacy in [place] except her novolog and test strips. 30 days sent through. Advised patient to call the office if she is not going to be able to establish with a new physician in the next 30 days.

## 2020-04-06 NOTE — Telephone Encounter (Signed)
Thanks Megan!

## 2020-05-02 ENCOUNTER — Other Ambulatory Visit: Payer: Self-pay | Admitting: Internal Medicine

## 2020-05-02 ENCOUNTER — Other Ambulatory Visit: Payer: Self-pay | Admitting: Family Medicine

## 2020-05-02 DIAGNOSIS — E11319 Type 2 diabetes mellitus with unspecified diabetic retinopathy without macular edema: Secondary | ICD-10-CM

## 2020-05-02 DIAGNOSIS — E1142 Type 2 diabetes mellitus with diabetic polyneuropathy: Secondary | ICD-10-CM

## 2020-05-02 DIAGNOSIS — IMO0002 Reserved for concepts with insufficient information to code with codable children: Secondary | ICD-10-CM

## 2020-05-16 ENCOUNTER — Other Ambulatory Visit: Payer: Self-pay | Admitting: Internal Medicine

## 2020-05-16 DIAGNOSIS — E1165 Type 2 diabetes mellitus with hyperglycemia: Secondary | ICD-10-CM

## 2020-05-16 DIAGNOSIS — IMO0002 Reserved for concepts with insufficient information to code with codable children: Secondary | ICD-10-CM

## 2020-07-10 ENCOUNTER — Other Ambulatory Visit: Payer: Self-pay | Admitting: Internal Medicine

## 2020-07-10 DIAGNOSIS — I70245 Atherosclerosis of native arteries of left leg with ulceration of other part of foot: Secondary | ICD-10-CM

## 2020-07-10 NOTE — Telephone Encounter (Signed)
Requested medication (s) are due for refill today: yes   Requested medication (s) are on the active medication list: yes   Last refill:03/25/2020  Future visit scheduled: no   Notes to clinic: overdue for follow up    Requested Prescriptions  Pending Prescriptions Disp Refills   clopidogrel (PLAVIX) 75 MG tablet [Pharmacy Med Name: CLOPIDOGREL 75MG  TABLETS] 90 tablet 0    Sig: TAKE 1 TABLET(75 MG) BY MOUTH DAILY      Hematology: Antiplatelets - clopidogrel Failed - 07/10/2020 10:57 AM      Failed - Evaluate AST, ALT within 2 months of therapy initiation.      Failed - HCT in normal range and within 180 days    Hematocrit  Date Value Ref Range Status  07/09/2019 38.6 34.0 - 46.6 % Final          Failed - HGB in normal range and within 180 days    Hemoglobin  Date Value Ref Range Status  07/09/2019 13.0 11.1 - 15.9 g/dL Final          Failed - PLT in normal range and within 180 days    Platelets  Date Value Ref Range Status  07/09/2019 525 (H) 150 - 450 x10E3/uL Final          Failed - Valid encounter within last 6 months    Recent Outpatient Visits           8 months ago Type II diabetes mellitus with complication [facility])   [facility] Glean Hess, MD   1 year ago Type II diabetes mellitus with complication [facility])   [facility] Glean Hess, MD   1 year ago Type II diabetes mellitus with complication [facility])   [facility] Glean Hess, MD   2 years ago 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

## 2021-02-25 ENCOUNTER — Telehealth: Payer: Self-pay

## 2021-02-25 NOTE — Telephone Encounter (Signed)
Transition Care Management Follow-up Telephone Call Date of discharge and from where: 02/24/2021-[facility] How have you been since you were released from the hospital? Pt stated she is ok and waiting to see if a test can be done.  Any questions or concerns? No  Items Reviewed: Did the pt receive and understand the discharge instructions provided? Yes  Medications obtained and verified? Yes  Other? No  Any new allergies since your discharge? No  Dietary orders reviewed? No Do you have support at home? Yes   Home Care and Equipment/Supplies: Were home health services ordered? not applicable If so, what is the name of the agency? N/A  Has the agency set up a time to come to the patient's home? not applicable Were any new equipment or medical supplies ordered?  No What is the name of the medical supply agency? N/A Were you able to get the supplies/equipment? not applicable Do you have any questions related to the use of the equipment or supplies? No  Functional Questionnaire: (I = Independent and D = Dependent) ADLs: I  Bathing/Dressing- I  Meal Prep- I  Eating- I  Maintaining continence- I  Transferring/Ambulation- I  Managing Meds- I  Follow up appointments reviewed:  PCP Hospital f/u appt confirmed? No   Specialist Hospital f/u appt confirmed? Yes  Scheduled to see Cardiology on 04/13/2021 @ 11:15am. Are transportation arrangements needed? No  If their condition worsens, is the pt aware to call PCP or go to the Emergency Dept.? Yes Was the patient provided with contact information for the PCP's office or ED? Yes Was to pt encouraged to call back with questions or concerns? Yes
